# Patient Record
Sex: Female | Born: 1945
Health system: Southern US, Community
[De-identification: ages and names within clinical notes are randomized; demographics above are authoritative.]

## PROBLEM LIST (undated history)

## (undated) DIAGNOSIS — Z923 Personal history of irradiation: Secondary | ICD-10-CM

## (undated) DIAGNOSIS — E079 Disorder of thyroid, unspecified: Secondary | ICD-10-CM

## (undated) DIAGNOSIS — M810 Age-related osteoporosis without current pathological fracture: Secondary | ICD-10-CM

## (undated) DIAGNOSIS — I341 Nonrheumatic mitral (valve) prolapse: Secondary | ICD-10-CM

## (undated) DIAGNOSIS — R569 Unspecified convulsions: Secondary | ICD-10-CM

## (undated) DIAGNOSIS — E785 Hyperlipidemia, unspecified: Secondary | ICD-10-CM

## (undated) DIAGNOSIS — K219 Gastro-esophageal reflux disease without esophagitis: Secondary | ICD-10-CM

## (undated) DIAGNOSIS — Z803 Family history of malignant neoplasm of breast: Secondary | ICD-10-CM

## (undated) DIAGNOSIS — Z859 Personal history of malignant neoplasm, unspecified: Secondary | ICD-10-CM

## (undated) DIAGNOSIS — F419 Anxiety disorder, unspecified: Secondary | ICD-10-CM

## (undated) HISTORY — DX: Age-related osteoporosis without current pathological fracture: M81.0

## (undated) HISTORY — DX: Family history of malignant neoplasm of breast: Z80.3

## (undated) HISTORY — PX: ABDOMINAL HYSTERECTOMY: SHX81

## (undated) HISTORY — PX: APPENDECTOMY: SHX54

## (undated) HISTORY — DX: Hyperlipidemia, unspecified: E78.5

## (undated) HISTORY — DX: Anxiety disorder, unspecified: F41.9

## (undated) HISTORY — DX: Disorder of thyroid, unspecified: E07.9

## (undated) HISTORY — DX: Nonrheumatic mitral (valve) prolapse: I34.1

## (undated) HISTORY — DX: Personal history of malignant neoplasm, unspecified: Z85.9

## (undated) HISTORY — DX: Unspecified convulsions: R56.9

---

## 1993-04-11 HISTORY — PX: BREAST EXCISIONAL BIOPSY: SUR124

## 2014-04-11 HISTORY — PX: BREAST LUMPECTOMY: SHX2

## 2015-04-12 HISTORY — PX: MASTECTOMY, PARTIAL: SHX709

## 2016-04-10 HISTORY — PX: MASTECTOMY PARTIAL / LUMPECTOMY: SUR851

## 2019-09-06 DIAGNOSIS — Z170 Estrogen receptor positive status [ER+]: Secondary | ICD-10-CM | POA: Insufficient documentation

## 2019-09-06 DIAGNOSIS — E039 Hypothyroidism, unspecified: Secondary | ICD-10-CM | POA: Insufficient documentation

## 2019-09-06 DIAGNOSIS — G2581 Restless legs syndrome: Secondary | ICD-10-CM | POA: Insufficient documentation

## 2019-09-06 DIAGNOSIS — G40909 Epilepsy, unspecified, not intractable, without status epilepticus: Secondary | ICD-10-CM | POA: Insufficient documentation

## 2019-09-06 DIAGNOSIS — E7800 Pure hypercholesterolemia, unspecified: Secondary | ICD-10-CM | POA: Insufficient documentation

## 2019-09-06 DIAGNOSIS — C50912 Malignant neoplasm of unspecified site of left female breast: Secondary | ICD-10-CM | POA: Insufficient documentation

## 2019-09-06 DIAGNOSIS — F325 Major depressive disorder, single episode, in full remission: Secondary | ICD-10-CM | POA: Insufficient documentation

## 2019-09-25 ENCOUNTER — Inpatient Hospital Stay: Payer: Medicare HMO

## 2019-09-25 ENCOUNTER — Encounter: Payer: Self-pay | Admitting: Internal Medicine

## 2019-09-25 ENCOUNTER — Inpatient Hospital Stay: Payer: Medicare HMO | Admitting: Internal Medicine

## 2019-10-01 ENCOUNTER — Inpatient Hospital Stay: Payer: Medicare HMO

## 2019-10-01 ENCOUNTER — Inpatient Hospital Stay: Payer: Medicare HMO | Attending: Internal Medicine | Admitting: Internal Medicine

## 2019-10-01 ENCOUNTER — Encounter: Payer: Self-pay | Admitting: Internal Medicine

## 2019-10-01 ENCOUNTER — Other Ambulatory Visit: Payer: Self-pay

## 2019-10-01 ENCOUNTER — Encounter (INDEPENDENT_AMBULATORY_CARE_PROVIDER_SITE_OTHER): Payer: Self-pay

## 2019-10-01 DIAGNOSIS — Z803 Family history of malignant neoplasm of breast: Secondary | ICD-10-CM | POA: Diagnosis not present

## 2019-10-01 DIAGNOSIS — Z87891 Personal history of nicotine dependence: Secondary | ICD-10-CM

## 2019-10-01 DIAGNOSIS — Z79811 Long term (current) use of aromatase inhibitors: Secondary | ICD-10-CM

## 2019-10-01 DIAGNOSIS — M810 Age-related osteoporosis without current pathological fracture: Secondary | ICD-10-CM | POA: Diagnosis not present

## 2019-10-01 DIAGNOSIS — Z170 Estrogen receptor positive status [ER+]: Secondary | ICD-10-CM

## 2019-10-01 DIAGNOSIS — Z8262 Family history of osteoporosis: Secondary | ICD-10-CM | POA: Diagnosis not present

## 2019-10-01 DIAGNOSIS — C50812 Malignant neoplasm of overlapping sites of left female breast: Secondary | ICD-10-CM

## 2019-10-01 MED ORDER — ANASTROZOLE 1 MG PO TABS: 1.0000 mg | ORAL_TABLET | Freq: Every day | ORAL | 1 refills | Status: DC

## 2019-10-01 NOTE — Assessment & Plan Note (Deleted)
#   Left breast   # Surveillance osteoporosis/ BMD- ? Pill; previously on "drip" once a year.    # family history of breast cancer [38 mom dx- with breast cancer/died]; will make referal to genetics counseling.   # DISPOSITION: # labs today # genetics cousenlling referral

## 2019-10-01 NOTE — Assessment & Plan Note (Addendum)
#   Left breast cancer ER positive; early stage [2017; as reported by patient; no records available].  S/p radiation.  Stable.  Recommend at least 5 years of Arimidex.  Tolerating well.  Refill prescription for Arimidex  #Clinically no evidence of recurrence noted.  Patient due for mammogram June 2021.  We will try to get records/mammogram for baseline.  Will order mammograms when baseline imaging available.  # Osteoporosis [as per patient]/ BMD-currently on? Pill; previously on "drip" once a year.  Patient states that she is  # Family history of breast cancer [38 mom dx- with breast cancer/died]; will make referal to genetics counseling.  Previously recommended however issues with insurance/coverage.   Thank you Dr.Baboff for allowing me to participate in the care of your pleasant patient. Please do not hesitate to contact me with questions or concerns in the interim.  # DISPOSITION: # genetics cousenlling referral   # follow up in 6 months- MD; labs- cbc/cmp-Dr.B

## 2019-10-01 NOTE — Progress Notes (Signed)
[facility] NOTE  Patient Care Team: Derinda Late, MD as PCP - General (Family Medicine)  CHIEF COMPLAINTS/PURPOSE OF CONSULTATION: Breast cancer  #  Oncology History Overview Note  # 2017- LEFT BREAST CANCER ER POSITIVE; Her-2 NEG [s/p lumpectomy]; s/p RT; no chemo; ?   #Anastrozole  # SURVIVORSHIP:   # GENETICS: declined because of expense/ insurance  DIAGNOSIS: Left breast cancer  STAGE: ? Early stage        ;  GOALS: cure  CURRENT/MOST RECENT THERAPY : on arimidex     Malignant neoplasm of left breast in female, estrogen receptor positive ([place]) (Resolved)  09/06/2019 Initial Diagnosis   Malignant neoplasm of left breast in female, estrogen receptor positive ([place])   Carcinoma of overlapping sites of left breast in female, estrogen receptor positive ([place])  10/01/2019 Initial Diagnosis   Carcinoma of overlapping sites of left breast in female, estrogen receptor positive ([place])      HISTORY OF PRESENTING ILLNESS:  Tina Armstrong 74 y.o.  female prior history of breast cancer -early stage is here for new consultation/establish care.   Patient states she was found to have an abnormal screening mammogram which led to further work-up-including biopsy positive for breast cancer.  Patient underwent treatment-including surgery lumpectomy in [place].  She is not aware of the stage; however was told to be low risk.  She received radiation.  Did not receive chemotherapy.  She is currently on anastrozole.  Patient has been tolerating anastrozole fairly well.  Denies any worsening body aches joint pains but no nausea no vomiting.  No headaches.  Review of Systems  Constitutional: Negative for chills, diaphoresis, fever, malaise/fatigue and weight loss.  HENT: Negative for nosebleeds and sore throat.   Eyes: Negative for double vision.  Respiratory: Negative for cough, hemoptysis, sputum production, shortness of breath and wheezing.   Cardiovascular: Negative  for chest pain, palpitations, orthopnea and leg swelling.  Gastrointestinal: Negative for abdominal pain, blood in stool, constipation, diarrhea, heartburn, melena, nausea and vomiting.  Genitourinary: Negative for dysuria, frequency and urgency.  Musculoskeletal: Negative for back pain and joint pain.  Skin: Negative.  Negative for itching and rash.  Neurological: Negative for dizziness, tingling, focal weakness, weakness and headaches.  Endo/Heme/Allergies: Does not bruise/bleed easily.  Psychiatric/Behavioral: Negative for depression. The patient is not nervous/anxious and does not have insomnia.      MEDICAL HISTORY:  Past Medical History:  Diagnosis Date  . Anxiety   . History of cancer    breast  . Hyperlipemia   . Mitral valve prolapse   . Osteoporosis   . Seizures ([place])   . Thyroid disease     SURGICAL HISTORY: Past Surgical History:  Procedure Laterality Date  . ABDOMINAL HYSTERECTOMY     total  . APPENDECTOMY    . MASTECTOMY PARTIAL / LUMPECTOMY Left 04/10/2016  . MASTECTOMY, PARTIAL Left 04/12/2015    SOCIAL HISTORY: Social History   Socioeconomic History  . Marital status: Widowed    Spouse name: Not on file  . Number of children: Not on file  . Years of education: Not on file  . Highest education level: Not on file  Occupational History  . Not on file  Tobacco Use  . Smoking status: Former Smoker    Start date: 2002    Quit date: 2007    Years since quitting: 14.4  . Smokeless tobacco: Never Used  Substance and Sexual Activity  . Alcohol use: Yes    Alcohol/week: 1.0  standard drink    Types: 1 Glasses of wine per week  . Drug use: Never  . Sexual activity: Not on file  Other Topics Concern  . Not on file  Social History Narrative   Moved from [place] at age of 68 years; moved from [place] to [place] for family reasons- lives in [place] daughter.  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    Social Determinants of Health    Financial Resource Strain:   . Difficulty of Paying Living Expenses:   Food Insecurity:   . Worried About Charity fundraiser in the Last Year:   . Arboriculturist in the Last Year:   Transportation Needs:   . Film/video editor (Medical):   Marland Kitchen Lack of Transportation (Non-Medical):   Physical Activity:   . Days of Exercise per Week:   . Minutes of Exercise per Session:   Stress:   . Feeling of Stress :   Social Connections:   . Frequency of Communication with Friends and Family:   . Frequency of Social Gatherings with Friends and Family:   . Attends Religious Services:   . Active Member of Clubs or Organizations:   . Attends Archivist Meetings:   Marland Kitchen Marital Status:   Intimate Partner Violence:   . Fear of Current or Ex-Partner:   . Emotionally Abused:   Marland Kitchen Physically Abused:   . Sexually Abused:     FAMILY HISTORY: Family History  Problem Relation Age of Onset  . Breast cancer Mother   . Heart attack Father   . Heart attack Brother   . Osteoporosis Paternal Grandmother   . Heart attack Brother   . Heart attack Brother     ALLERGIES:  has No Known Allergies.  MEDICATIONS:  Current Outpatient Medications  Medication Sig Dispense Refill  . anastrozole (ARIMIDEX) 1 MG tablet Take 1 tablet (1 mg total) by mouth daily. 90 tablet 1  . atenolol (TENORMIN) 25 MG tablet Take 25 mg by mouth daily.    Marland Kitchen atorvastatin (LIPITOR) 20 MG tablet Take 1 tablet by mouth daily.    . Cholecalciferol 25 MCG (1000 UT) capsule Take 1,000 Units by mouth daily.    . cyanocobalamin 1000 MCG tablet Take 1,000 mcg by mouth daily.    . ferrous sulfate 325 (65 FE) MG tablet Take 325 mg by mouth daily.    Marland Kitchen gabapentin (NEURONTIN) 300 MG capsule Take 300 mg by mouth daily.    Marland Kitchen levothyroxine (SYNTHROID) 100 MCG tablet Take 100 mcg by mouth daily.    . meloxicam (MOBIC) 7.5 MG tablet Take 7.5 mg by mouth daily.    Marland Kitchen PARoxetine (PAXIL) 30 MG tablet Take 30 mg by mouth daily.    .  Potassium Gluconate 2.5 MEQ TABS Take 2.5 mEq by mouth daily.    Marland Kitchen zinc gluconate 50 MG tablet Take 50 mg by mouth daily.     No current facility-administered medications for this visit.      Marland Kitchen  PHYSICAL EXAMINATION: ECOG PERFORMANCE STATUS: 0 - Asymptomatic  Vitals:   10/01/19 1119  BP: (!) 96/55  Pulse: 61  Resp: 16  Temp: (!) 97.1 F (36.2 C)  SpO2: 99%   Filed Weights   10/01/19 1119  Weight: 131 lb 3.2 oz (59.5 kg)    Physical Exam HENT:     Head: Normocephalic and atraumatic.     Mouth/Throat:     Pharynx: No oropharyngeal exudate.  Eyes:  Pupils: Pupils are equal, round, and reactive to light.  Cardiovascular:     Rate and Rhythm: Normal rate and regular rhythm.  Pulmonary:     Effort: Pulmonary effort is normal. No respiratory distress.     Breath sounds: Normal breath sounds. No wheezing.  Abdominal:     General: Bowel sounds are normal. There is no distension.     Palpations: Abdomen is soft. There is no mass.     Tenderness: There is no abdominal tenderness. There is no guarding or rebound.  Musculoskeletal:        General: No tenderness. Normal range of motion.     Cervical back: Normal range of motion and neck supple.  Skin:    General: Skin is warm.     Comments: Right and left BREAST exam (in the presence of nurse)- no unusual skin changes or dominant masses felt. Surgical scars noted.    Neurological:     Mental Status: She is alert and oriented to person, place, and time.  Psychiatric:        Mood and Affect: Affect normal.      LABORATORY DATA:  I have reviewed the data as listed No results found for: WBC, HGB, HCT, MCV, PLT No results for input(s): NA, K, CL, CO2, GLUCOSE, BUN, CREATININE, CALCIUM, GFRNONAA, GFRAA, PROT, ALBUMIN, AST, ALT, ALKPHOS, BILITOT, BILIDIR, IBILI in the last 8760 hours.  RADIOGRAPHIC STUDIES: I have personally reviewed the radiological images as listed and agreed with the findings in the report. No results  found.  ASSESSMENT & PLAN:   Carcinoma of overlapping sites of left breast in female, estrogen receptor positive ([place]) # Left breast cancer ER positive; early stage [2017; as reported by patient; no records available].  S/p radiation.  Stable.  Recommend at least 5 years of Arimidex.  Tolerating well.  Refill prescription for Arimidex  #Clinically no evidence of recurrence noted.  Patient due for mammogram June 2021.  We will try to get records/mammogram for baseline.  Will order mammograms when baseline imaging available.  # Osteoporosis [as per patient]/ BMD-currently on? Pill; previously on "drip" once a year.  Patient states that she is  # Family history of breast cancer [38 mom dx- with breast cancer/died]; will make referal to genetics counseling.  Previously recommended however issues with insurance/coverage.   Thank you Dr.Baboff for allowing me to participate in the care of your pleasant patient. Please do not hesitate to contact me with questions or concerns in the interim.  # DISPOSITION: # genetics cousenlling referral   # follow up in 6 months- MD; labs- cbc/cmp-Dr.B  All questions were answered. The patient/family knows to call the clinic with any problems, questions or concerns.    Cammie Sickle, MD 10/01/2019 12:38 PM

## 2019-10-09 ENCOUNTER — Encounter: Payer: Self-pay | Admitting: Licensed Clinical Social Worker

## 2019-10-09 ENCOUNTER — Inpatient Hospital Stay (HOSPITAL_BASED_OUTPATIENT_CLINIC_OR_DEPARTMENT_OTHER): Payer: Medicare HMO | Admitting: Licensed Clinical Social Worker

## 2019-10-09 ENCOUNTER — Other Ambulatory Visit: Payer: Self-pay

## 2019-10-09 ENCOUNTER — Inpatient Hospital Stay: Payer: Medicare HMO

## 2019-10-09 DIAGNOSIS — Z803 Family history of malignant neoplasm of breast: Secondary | ICD-10-CM | POA: Diagnosis not present

## 2019-10-09 DIAGNOSIS — Z170 Estrogen receptor positive status [ER+]: Secondary | ICD-10-CM

## 2019-10-09 DIAGNOSIS — Z1379 Encounter for other screening for genetic and chromosomal anomalies: Secondary | ICD-10-CM | POA: Insufficient documentation

## 2019-10-09 DIAGNOSIS — C50812 Malignant neoplasm of overlapping sites of left female breast: Secondary | ICD-10-CM | POA: Diagnosis not present

## 2019-10-09 DIAGNOSIS — C50412 Malignant neoplasm of upper-outer quadrant of left female breast: Secondary | ICD-10-CM | POA: Diagnosis not present

## 2019-10-09 NOTE — Progress Notes (Signed)
REFERRING PROVIDER: Cammie Sickle, MD [place],  [place] [id number]  PRIMARY PROVIDER:  Derinda Late, MD  PRIMARY REASON FOR VISIT:  1. Carcinoma of overlapping sites of left breast in female, estrogen receptor positive ([place])   2. Family history of breast cancer     HISTORY OF PRESENT ILLNESS:   Tina Armstrong, a 74 y.o. female, was seen for a [facility] cancer genetics consultation at the request of Dr. Rogue Armstrong due to a personal and family history of cancer.  Tina Armstrong presents to clinic today to discuss the possibility of a hereditary predisposition to cancer, genetic testing, and to further clarify her future cancer risks, as well as potential cancer risks for family members.   At the age of 64, Tina Armstrong was diagnosed with cancer of the left breast. This was treated with lumpectomy, radiation, and arimidex.    CANCER HISTORY:  Oncology History Overview Note  # 2017- LEFT BREAST CANCER ER POSITIVE; Her-2 NEG [s/p lumpectomy]; s/p RT; no chemo; ?   #Anastrozole  # SURVIVORSHIP:   # GENETICS: declined because of expense/ insurance  DIAGNOSIS: Left breast cancer  STAGE: ? Early stage        ;  GOALS: cure  CURRENT/MOST RECENT THERAPY : on arimidex     Malignant neoplasm of left breast in female, estrogen receptor positive ([facility]) (Resolved)  09/06/2019 Initial Diagnosis   Malignant neoplasm of left breast in female, estrogen receptor positive ([place])   Carcinoma of overlapping sites of left breast in female, estrogen receptor positive ([place])  10/01/2019 Initial Diagnosis   Carcinoma of overlapping sites of left breast in female, estrogen receptor positive ([place])      RISK FACTORS:  Menarche was at age 19.  First live birth at age 11.  OCP use for approximately >10 years.  Ovaries intact: no.  Hysterectomy: yes.  Menopausal status: postmenopausal.  HRT use: possibly for a short time.  Colonoscopy: yes; polyps, she reports less than  10. Mammogram within the last year: yes. Number of breast biopsies: 2.   Past Medical History:  Diagnosis Date  . Anxiety   . Family history of breast cancer   . History of cancer    breast  . Hyperlipemia   . Mitral valve prolapse   . Osteoporosis   . Seizures ([place])   . Thyroid disease     Past Surgical History:  Procedure Laterality Date  . ABDOMINAL HYSTERECTOMY     total  . APPENDECTOMY    . MASTECTOMY PARTIAL / LUMPECTOMY Left 04/10/2016  . MASTECTOMY, PARTIAL Left 04/12/2015    Social History   Socioeconomic History  . Marital status: Widowed    Spouse name: Not on file  . Number of children: Not on file  . Years of education: Not on file  . Highest education level: Not on file  Occupational History  . Not on file  Tobacco Use  . Smoking status: Former Smoker    Start date: 2002    Quit date: 2007    Years since quitting: 14.5  . Smokeless tobacco: Never Used  Substance and Sexual Activity  . Alcohol use: Yes    Alcohol/week: 1.0 standard drink    Types: 1 Glasses of wine per week  . Drug use: Never  . Sexual activity: Not on file  Other Topics Concern  . Not on file  Social History Narrative   Moved from [place] at age of 38 years; moved from [place] to [place]  for family reasons- lives in [place].  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    Social Determinants of Health   Financial Resource Strain:   . Difficulty of Paying Living Expenses:   Food Insecurity:   . Worried About Charity fundraiser in the Last Year:   . Arboriculturist in the Last Year:   Transportation Needs:   . Film/video editor (Medical):   Marland Kitchen Lack of Transportation (Non-Medical):   Physical Activity:   . Days of Exercise per Week:   . Minutes of Exercise per Session:   Stress:   . Feeling of Stress :   Social Connections:   . Frequency of Communication with Friends and Family:   . Frequency of Social Gatherings with Friends and Family:   .  Attends Religious Services:   . Active Member of Clubs or Organizations:   . Attends Archivist Meetings:   Marland Kitchen Marital Status:      FAMILY HISTORY:  We obtained a detailed, 4-generation family history.  Significant diagnoses are listed below: Family History  Problem Relation Age of Onset  . Breast cancer Mother 34  . Heart attack Father   . Heart attack Brother   . Osteoporosis Paternal Grandmother   . Heart attack Brother   . Heart attack Brother    Tina Armstrong has a son, 79, and a daughter, 51, neither have had cancer. She had 3 brothers. One brother passed from a heart attack, her other brothers are living.   Tina Armstrong mother died of breast cancer at 38. Patient had 4 maternal aunts, 4 maternal uncles. No cancers for them or for first cousins that she is aware of, these family members are in [place]. No cancers in maternal grandparents.  Tina Armstrong father died at 71. He had one sister who died at 24, patient does not believe it was cancer. Paternal grandmother died young but patient is unsure of cause, grandfather died in his 18s-80s.  Tina Armstrong is unaware of previous family history of genetic testing for hereditary cancer risks. Patient's maternal ancestors are of [place] descent, and paternal ancestors are of [place] descent. There is no reported Ashkenazi Jewish ancestry. There is no known consanguinity.  GENETIC COUNSELING ASSESSMENT: Tina Armstrong is a 74 y.o. female with a personal and family history of breast cancer which is somewhat suggestive of a hereditary cancer syndrome and predisposition to cancer. We, therefore, discussed and recommended the following at today's visit.   DISCUSSION: We discussed that 5-10% of cancer is hereditary meaning that it is due to a mutation in a single gene that is passed down from generation to generation in a family. Most cases of hereditary breast cancer are associated with BRCA1 and BRCA2 genes, although there are other genes  associated with hereditary breast cancer as well.  We discussed that testing is beneficial for several reasons including knowing about other cancer risks, identifying potential screening and risk-reduction options that may be appropriate, and to understand if other family members could be at risk for cancer and allow them to undergo genetic testing.   We reviewed the characteristics, features and inheritance patterns of hereditary cancer syndromes. We also discussed genetic testing, including the appropriate family members to test, the process of testing, insurance coverage and turn-around-time for results. We discussed the implications of a negative, positive and/or variant of uncertain significant result. We recommended Tina Armstrong pursue genetic testing for the [facility] Common Hereditary Cancers gene  panel.   The Common Hereditary Cancers Panel offered by [facility] includes sequencing and/or deletion duplication testing of the following 48 genes: APC, ATM, AXIN2, BARD1, BMPR1A, BRCA1, BRCA2, BRIP1, CDH1, CDKN2A (p14ARF), CDKN2A (p16INK4a), CKD4, CHEK2, CTNNA1, DICER1, EPCAM (Deletion/duplication testing only), GREM1 (promoter region deletion/duplication testing only), KIT, MEN1, MLH1, MSH2, MSH3, MSH6, MUTYH, NBN, NF1, NHTL1, PALB2, PDGFRA, PMS2, POLD1, POLE, PTEN, RAD50, RAD51C, RAD51D, RNF43, SDHB, SDHC, SDHD, SMAD4, SMARCA4. STK11, TP53, TSC1, TSC2, and VHL.  The following genes were evaluated for sequence changes only: SDHA and HOXB13 c.251G>A variant only.  Based on Tina Armstrong personal and family history of cancer, she meets medical criteria for genetic testing. Despite that she meets criteria, she may still have an out of pocket cost. We discussed that if her out of pocket cost for testing is over $100, the laboratory will call and confirm whether she wants to proceed with testing.  If the out of pocket cost of testing is less than $100 she will be billed by the genetic testing laboratory.   PLAN:  Despite our recommendation, Tina Armstrong did not wish to pursue genetic testing at today's visit. She will speak with her daughter and granddaughter and let me know if she changes her mind. We understand this decision and remain available to coordinate genetic testing at any time in the future. We, therefore, recommend Tina Armstrong continue to follow the cancer screening guidelines given by her primary healthcare provider.  Based on Tina Armstrong family history, we recommended her maternal relatives have genetic counseling and testing. Tina Armstrong will let us know if we can be of any assistance in coordinating genetic counseling and/or testing for this family member.   Lastly, we encouraged Tina Armstrong to remain in contact with cancer genetics annually so that we can continuously update the family history and inform her of any changes in cancer genetics and testing that may be of benefit for this family.   Tina Armstrong questions were answered to her satisfaction today. Our contact information was provided should additional questions or concerns arise. Thank you for the referral and allowing [place] to share in the care of your patient.   Faith Rogue, MS, [facility] Genetic Counselor [email or web address] Phone: [phone]  The patient was seen for a total of 25 minutes in face-to-face genetic counseling.  Dr. Grayland Ormond was available for discussion regarding this case.   _______________________________________________________________________ For Office Staff:  Number of people involved in session: 1 Was an Intern/ student involved with case: no

## 2019-10-15 ENCOUNTER — Other Ambulatory Visit: Payer: Self-pay | Admitting: *Deleted

## 2019-10-15 DIAGNOSIS — C50812 Malignant neoplasm of overlapping sites of left female breast: Secondary | ICD-10-CM

## 2019-10-16 ENCOUNTER — Telehealth: Payer: Self-pay | Admitting: *Deleted

## 2019-10-16 NOTE — Telephone Encounter (Signed)
I received the CD's on Tuesday. I need to hand walk these to [facility]. Then after [facility] reviews, they can schedule the mammo.

## 2019-10-16 NOTE — Telephone Encounter (Signed)
Anderson Malta, RN hand delivered the CD's to Tina Armstrong this afternoon. Patient will be contacted by the [facility] dept with apt. Attempted to reach patient. No answer. Vm box not set up.

## 2019-10-16 NOTE — Telephone Encounter (Signed)
Patient called reporting that she is overdue for her mammogram and she would like to get one ordered even if you do not have her old records yet

## 2019-10-21 ENCOUNTER — Other Ambulatory Visit: Payer: Self-pay | Admitting: *Deleted

## 2019-10-21 DIAGNOSIS — Z170 Estrogen receptor positive status [ER+]: Secondary | ICD-10-CM

## 2019-10-21 NOTE — Telephone Encounter (Signed)
Colette, can you follow-up on the status of the mammogram.

## 2019-10-29 ENCOUNTER — Ambulatory Visit
Admission: RE | Admit: 2019-10-29 | Discharge: 2019-10-29 | Disposition: A | Discharge status: Home / Self Care | Payer: Medicare HMO | Source: Ambulatory Visit | Attending: Internal Medicine | Admitting: Internal Medicine

## 2019-10-29 ENCOUNTER — Encounter: Payer: Self-pay | Admitting: Radiology

## 2019-10-29 DIAGNOSIS — R921 Mammographic calcification found on diagnostic imaging of breast: Secondary | ICD-10-CM | POA: Diagnosis not present

## 2019-10-29 DIAGNOSIS — Z170 Estrogen receptor positive status [ER+]: Secondary | ICD-10-CM

## 2019-10-29 DIAGNOSIS — C50812 Malignant neoplasm of overlapping sites of left female breast: Secondary | ICD-10-CM

## 2019-10-29 DIAGNOSIS — N6489 Other specified disorders of breast: Secondary | ICD-10-CM | POA: Diagnosis not present

## 2019-10-29 HISTORY — DX: Personal history of irradiation: Z92.3

## 2019-12-13 DIAGNOSIS — R0781 Pleurodynia: Secondary | ICD-10-CM | POA: Diagnosis not present

## 2019-12-13 DIAGNOSIS — W010XXA Fall on same level from slipping, tripping and stumbling without subsequent striking against object, initial encounter: Secondary | ICD-10-CM | POA: Diagnosis not present

## 2019-12-13 DIAGNOSIS — S4991XA Unspecified injury of right shoulder and upper arm, initial encounter: Secondary | ICD-10-CM | POA: Diagnosis not present

## 2019-12-13 DIAGNOSIS — S2241XA Multiple fractures of ribs, right side, initial encounter for closed fracture: Secondary | ICD-10-CM | POA: Diagnosis not present

## 2019-12-13 DIAGNOSIS — M47814 Spondylosis without myelopathy or radiculopathy, thoracic region: Secondary | ICD-10-CM | POA: Diagnosis not present

## 2019-12-13 DIAGNOSIS — Z043 Encounter for examination and observation following other accident: Secondary | ICD-10-CM | POA: Diagnosis not present

## 2019-12-13 DIAGNOSIS — M419 Scoliosis, unspecified: Secondary | ICD-10-CM | POA: Diagnosis not present

## 2019-12-19 ENCOUNTER — Other Ambulatory Visit: Payer: Self-pay | Admitting: *Deleted

## 2019-12-19 DIAGNOSIS — S2241XD Multiple fractures of ribs, right side, subsequent encounter for fracture with routine healing: Secondary | ICD-10-CM | POA: Diagnosis not present

## 2019-12-19 MED ORDER — ANASTROZOLE 1 MG PO TABS: 1.0000 mg | ORAL_TABLET | Freq: Every day | ORAL | 1 refills | Status: DC

## 2019-12-19 NOTE — Telephone Encounter (Signed)
Patient requests refill for anastrozole order pended per MD approval.

## 2020-01-09 DIAGNOSIS — Z0100 Encounter for examination of eyes and vision without abnormal findings: Secondary | ICD-10-CM | POA: Diagnosis not present

## 2020-01-09 DIAGNOSIS — Z79810 Long term (current) use of selective estrogen receptor modulators (SERMs): Secondary | ICD-10-CM | POA: Diagnosis not present

## 2020-01-09 DIAGNOSIS — H5212 Myopia, left eye: Secondary | ICD-10-CM | POA: Diagnosis not present

## 2020-01-09 DIAGNOSIS — H52222 Regular astigmatism, left eye: Secondary | ICD-10-CM | POA: Diagnosis not present

## 2020-01-09 DIAGNOSIS — H524 Presbyopia: Secondary | ICD-10-CM | POA: Diagnosis not present

## 2020-01-09 DIAGNOSIS — H43813 Vitreous degeneration, bilateral: Secondary | ICD-10-CM | POA: Diagnosis not present

## 2020-01-09 DIAGNOSIS — H40023 Open angle with borderline findings, high risk, bilateral: Secondary | ICD-10-CM | POA: Diagnosis not present

## 2020-01-09 DIAGNOSIS — H25813 Combined forms of age-related cataract, bilateral: Secondary | ICD-10-CM | POA: Diagnosis not present

## 2020-03-02 DIAGNOSIS — E7800 Pure hypercholesterolemia, unspecified: Secondary | ICD-10-CM | POA: Diagnosis not present

## 2020-03-02 DIAGNOSIS — E039 Hypothyroidism, unspecified: Secondary | ICD-10-CM | POA: Diagnosis not present

## 2020-03-02 DIAGNOSIS — Z79899 Other long term (current) drug therapy: Secondary | ICD-10-CM | POA: Diagnosis not present

## 2020-03-09 DIAGNOSIS — R059 Cough, unspecified: Secondary | ICD-10-CM | POA: Diagnosis not present

## 2020-03-09 DIAGNOSIS — C50919 Malignant neoplasm of unspecified site of unspecified female breast: Secondary | ICD-10-CM | POA: Diagnosis not present

## 2020-03-09 DIAGNOSIS — Z23 Encounter for immunization: Secondary | ICD-10-CM | POA: Diagnosis not present

## 2020-03-09 DIAGNOSIS — F32A Depression, unspecified: Secondary | ICD-10-CM | POA: Diagnosis not present

## 2020-03-09 DIAGNOSIS — G2581 Restless legs syndrome: Secondary | ICD-10-CM | POA: Diagnosis not present

## 2020-03-09 DIAGNOSIS — E785 Hyperlipidemia, unspecified: Secondary | ICD-10-CM | POA: Diagnosis not present

## 2020-03-09 DIAGNOSIS — Z79899 Other long term (current) drug therapy: Secondary | ICD-10-CM | POA: Diagnosis not present

## 2020-03-09 DIAGNOSIS — G40909 Epilepsy, unspecified, not intractable, without status epilepticus: Secondary | ICD-10-CM | POA: Diagnosis not present

## 2020-03-09 DIAGNOSIS — E039 Hypothyroidism, unspecified: Secondary | ICD-10-CM | POA: Diagnosis not present

## 2020-03-12 DIAGNOSIS — Z79810 Long term (current) use of selective estrogen receptor modulators (SERMs): Secondary | ICD-10-CM | POA: Diagnosis not present

## 2020-03-12 DIAGNOSIS — H40011 Open angle with borderline findings, low risk, right eye: Secondary | ICD-10-CM | POA: Diagnosis not present

## 2020-03-12 DIAGNOSIS — H401221 Low-tension glaucoma, left eye, mild stage: Secondary | ICD-10-CM | POA: Diagnosis not present

## 2020-03-18 DIAGNOSIS — I517 Cardiomegaly: Secondary | ICD-10-CM | POA: Diagnosis not present

## 2020-03-18 DIAGNOSIS — R059 Cough, unspecified: Secondary | ICD-10-CM | POA: Diagnosis not present

## 2020-03-24 ENCOUNTER — Encounter: Payer: Self-pay | Admitting: Internal Medicine

## 2020-03-24 ENCOUNTER — Other Ambulatory Visit: Payer: Self-pay

## 2020-03-24 ENCOUNTER — Inpatient Hospital Stay: Payer: Medicare HMO | Attending: Internal Medicine

## 2020-03-24 ENCOUNTER — Inpatient Hospital Stay (HOSPITAL_BASED_OUTPATIENT_CLINIC_OR_DEPARTMENT_OTHER): Payer: Medicare HMO | Admitting: Internal Medicine

## 2020-03-24 ENCOUNTER — Other Ambulatory Visit
Admission: RE | Admit: 2020-03-24 | Discharge: 2020-03-24 | Disposition: A | Discharge status: Home / Self Care | Payer: Medicare HMO | Attending: Internal Medicine | Admitting: Internal Medicine

## 2020-03-24 DIAGNOSIS — Z803 Family history of malignant neoplasm of breast: Secondary | ICD-10-CM | POA: Diagnosis not present

## 2020-03-24 DIAGNOSIS — Z8249 Family history of ischemic heart disease and other diseases of the circulatory system: Secondary | ICD-10-CM | POA: Diagnosis not present

## 2020-03-24 DIAGNOSIS — Z170 Estrogen receptor positive status [ER+]: Secondary | ICD-10-CM | POA: Diagnosis not present

## 2020-03-24 DIAGNOSIS — C50812 Malignant neoplasm of overlapping sites of left female breast: Secondary | ICD-10-CM | POA: Insufficient documentation

## 2020-03-24 DIAGNOSIS — Z79899 Other long term (current) drug therapy: Secondary | ICD-10-CM | POA: Insufficient documentation

## 2020-03-24 DIAGNOSIS — Z90710 Acquired absence of both cervix and uterus: Secondary | ICD-10-CM | POA: Insufficient documentation

## 2020-03-24 DIAGNOSIS — E785 Hyperlipidemia, unspecified: Secondary | ICD-10-CM | POA: Diagnosis not present

## 2020-03-24 DIAGNOSIS — Z923 Personal history of irradiation: Secondary | ICD-10-CM | POA: Diagnosis not present

## 2020-03-24 DIAGNOSIS — Z79811 Long term (current) use of aromatase inhibitors: Secondary | ICD-10-CM | POA: Diagnosis not present

## 2020-03-24 DIAGNOSIS — Z87891 Personal history of nicotine dependence: Secondary | ICD-10-CM | POA: Diagnosis not present

## 2020-03-24 DIAGNOSIS — Z8262 Family history of osteoporosis: Secondary | ICD-10-CM | POA: Diagnosis not present

## 2020-03-24 LAB — COMPREHENSIVE METABOLIC PANEL
ALT: 22 U/L (ref 0–44)
AST: 27 U/L (ref 15–41)
Albumin: 4 g/dL (ref 3.5–5.0)
Alkaline Phosphatase: 69 U/L (ref 38–126)
Anion gap: 8 (ref 5–15)
BUN: 19 mg/dL (ref 8–23)
CO2: 26 mmol/L (ref 22–32)
Calcium: 9 mg/dL (ref 8.9–10.3)
Chloride: 104 mmol/L (ref 98–111)
Creatinine, Ser: 0.84 mg/dL (ref 0.44–1.00)
GFR, Estimated: >60 mL/min (ref >60)
Glucose, Bld: 90 mg/dL (ref 70–99)
Potassium: 4.5 mmol/L (ref 3.5–5.1)
Sodium: 138 mmol/L (ref 135–145)
Total Bilirubin: 0.7 mg/dL (ref 0.3–1.2)
Total Protein: 7.4 g/dL (ref 6.5–8.1)

## 2020-03-24 LAB — CBC WITH DIFFERENTIAL/PLATELET
Abs Immature Granulocytes: 0.02 10*3/uL (ref 0.00–0.07)
Basophils Absolute: 0.1 10*3/uL (ref 0.0–0.1)
Basophils Relative: 1 %
Eosinophils Absolute: 0.2 10*3/uL (ref 0.0–0.5)
Eosinophils Relative: 5 %
HCT: 38.6 % (ref 36.0–46.0)
Hemoglobin: 12.6 g/dL (ref 12.0–15.0)
Immature Granulocytes: 0 %
Lymphocytes Relative: 14 %
Lymphs Abs: 0.7 10*3/uL (ref 0.7–4.0)
MCH: 31.3 pg (ref 26.0–34.0)
MCHC: 32.6 g/dL (ref 30.0–36.0)
MCV: 95.8 fL (ref 80.0–100.0)
Monocytes Absolute: 0.7 10*3/uL (ref 0.1–1.0)
Monocytes Relative: 13 %
Neutro Abs: 3.2 10*3/uL (ref 1.7–7.7)
Neutrophils Relative %: 67 %
Platelets: 224 10*3/uL (ref 150–400)
RBC: 4.03 MIL/uL (ref 3.87–5.11)
RDW: 12.7 % (ref 11.5–15.5)
WBC: 4.9 10*3/uL (ref 4.0–10.5)
nRBC: 0 % (ref 0.0–0.2)

## 2020-03-24 NOTE — Progress Notes (Signed)
[facility] NOTE  Patient Care Team: Derinda Late, MD as PCP - General (Family Medicine)  CHIEF COMPLAINTS/PURPOSE OF CONSULTATION: Breast cancer  #  Oncology History Overview Note  # 2017- LEFT BREAST CANCER ER POSITIVE; Her-2 NEG [s/p lumpectomy]; s/p RT; no chemo; ?  [[place]]  #Anastrozole  # SURVIVORSHIP:   # GENETICS: declined because of expense/ insurance; july2021-s/p counseling; declines work up  DIAGNOSIS: Left breast cancer  STAGE: Early stage        ;  GOALS: cure  CURRENT/MOST RECENT THERAPY : on arimidex     Malignant neoplasm of left breast in female, estrogen receptor positive ([place]) (Resolved)  09/06/2019 Initial Diagnosis   Malignant neoplasm of left breast in female, estrogen receptor positive ([place])   Carcinoma of overlapping sites of left breast in female, estrogen receptor positive ([place])  10/01/2019 Initial Diagnosis   Carcinoma of overlapping sites of left breast in female, estrogen receptor positive ([place])      HISTORY OF PRESENTING ILLNESS:  Tina Armstrong 74 y.o.  female prior history of breast cancer -early stage is here for follow-up.  In the interim patient had a mammogram for tenderness in her left breast at site of lumpectomy scar.  No concerns of recurrent disease.  She continues to have intermittent pain at the site of surgery.  Otherwise no lumps or bumps anywhere else.  No nausea no vomiting no headaches.  No worsening joint pains or bone pain.   Review of Systems  Constitutional: Negative for chills, diaphoresis, fever, malaise/fatigue and weight loss.  HENT: Negative for nosebleeds and sore throat.   Eyes: Negative for double vision.  Respiratory: Negative for cough, hemoptysis, sputum production, shortness of breath and wheezing.   Cardiovascular: Negative for chest pain, palpitations, orthopnea and leg swelling.  Gastrointestinal: Negative for abdominal pain, blood in stool, constipation, diarrhea, heartburn,  melena, nausea and vomiting.  Genitourinary: Negative for dysuria, frequency and urgency.  Musculoskeletal: Negative for back pain and joint pain.  Skin: Negative.  Negative for itching and rash.  Neurological: Negative for dizziness, tingling, focal weakness, weakness and headaches.  Endo/Heme/Allergies: Does not bruise/bleed easily.  Psychiatric/Behavioral: Negative for depression. The patient is not nervous/anxious and does not have insomnia.      MEDICAL HISTORY:  Past Medical History:  Diagnosis Date  . Anxiety   . Family history of breast cancer   . History of cancer    breast  . Hyperlipemia   . Mitral valve prolapse   . Osteoporosis   . Personal history of radiation therapy    breast  . Seizures ([place])   . Thyroid disease     SURGICAL HISTORY: Past Surgical History:  Procedure Laterality Date  . ABDOMINAL HYSTERECTOMY     total  . APPENDECTOMY    . BREAST EXCISIONAL BIOPSY Left 1995  . BREAST LUMPECTOMY Left 2016  . MASTECTOMY PARTIAL / LUMPECTOMY Left 04/10/2016  . MASTECTOMY, PARTIAL Left 04/12/2015    SOCIAL HISTORY: Social History   Socioeconomic History  . Marital status: Widowed    Spouse name: Not on file  . Number of children: Not on file  . Years of education: Not on file  . Highest education level: Not on file  Occupational History  . Not on file  Tobacco Use  . Smoking status: Former Smoker    Start date: 2002    Quit date: 2007    Years since quitting: 14.9  . Smokeless tobacco: Never Used  Substance and Sexual  Activity  . Alcohol use: Yes    Alcohol/week: 1.0 standard drink    Types: 1 Glasses of wine per week  . Drug use: Never  . Sexual activity: Not on file  Other Topics Concern  . Not on file  Social History Narrative   Moved from [place] at age of 76 years; moved from [place] to [place] for family reasons- lives in [place] daughter.  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    Social Determinants of Health    Financial Resource Strain: Not on file  Food Insecurity: Not on file  Transportation Needs: Not on file  Physical Activity: Not on file  Stress: Not on file  Social Connections: Not on file  Intimate Partner Violence: Not on file    FAMILY HISTORY: Family History  Problem Relation Age of Onset  . Breast cancer Mother 100  . Heart attack Father   . Heart attack Brother   . Osteoporosis Paternal Grandmother   . Heart attack Brother   . Heart attack Brother     ALLERGIES:  has No Known Allergies.  MEDICATIONS:  Current Outpatient Medications  Medication Sig Dispense Refill  . anastrozole (ARIMIDEX) 1 MG tablet Take 1 tablet (1 mg total) by mouth daily. 90 tablet 1  . atenolol (TENORMIN) 25 MG tablet Take 25 mg by mouth daily.    Marland Kitchen atorvastatin (LIPITOR) 20 MG tablet Take 1 tablet by mouth daily.    . Cholecalciferol 25 MCG (1000 UT) capsule Take 1,000 Units by mouth daily.    . cyanocobalamin 1000 MCG tablet Take 1,000 mcg by mouth daily.    . ferrous sulfate 325 (65 FE) MG tablet Take 325 mg by mouth daily.    Marland Kitchen gabapentin (NEURONTIN) 300 MG capsule Take 300 mg by mouth daily.    Marland Kitchen levothyroxine (SYNTHROID) 100 MCG tablet Take 100 mcg by mouth daily.    . meloxicam (MOBIC) 7.5 MG tablet Take 7.5 mg by mouth daily.    . Potassium Gluconate 2.5 MEQ TABS Take 2.5 mEq by mouth daily.    Marland Kitchen zinc gluconate 50 MG tablet Take 50 mg by mouth daily.     No current facility-administered medications for this visit.      Marland Kitchen  PHYSICAL EXAMINATION: ECOG PERFORMANCE STATUS: 0 - Asymptomatic  Vitals:   03/24/20 1034  BP: 99/68  Pulse: (!) 58  Resp: 20  Temp: (!) 96.5 F (35.8 C)   Filed Weights   03/24/20 1034  Weight: 133 lb (60.3 kg)    Physical Exam HENT:     Head: Normocephalic and atraumatic.     Mouth/Throat:     Pharynx: No oropharyngeal exudate.  Eyes:     Pupils: Pupils are equal, round, and reactive to light.  Cardiovascular:     Rate and Rhythm: Normal rate  and regular rhythm.  Pulmonary:     Effort: Pulmonary effort is normal. No respiratory distress.     Breath sounds: Normal breath sounds. No wheezing.  Abdominal:     General: Bowel sounds are normal. There is no distension.     Palpations: Abdomen is soft. There is no mass.     Tenderness: There is no abdominal tenderness. There is no guarding or rebound.  Musculoskeletal:        General: No tenderness. Normal range of motion.     Cervical back: Normal range of motion and neck supple.  Skin:    General: Skin is warm.  Comments: Right and left BREAST exam (in the presence of nurse)- no unusual skin changes or dominant masses felt. Surgical scars noted.    Neurological:     Mental Status: She is alert and oriented to person, place, and time.  Psychiatric:        Mood and Affect: Affect normal.      LABORATORY DATA:  I have reviewed the data as listed Lab Results  Component Value Date   WBC 4.9 03/24/2020   HGB 12.6 03/24/2020   HCT 38.6 03/24/2020   MCV 95.8 03/24/2020   PLT 224 03/24/2020   Recent Labs    03/24/20 0935  NA 138  K 4.5  CL 104  CO2 26  GLUCOSE 90  BUN 19  CREATININE 0.84  CALCIUM 9.0  GFRNONAA >60  PROT 7.4  ALBUMIN 4.0  AST 27  ALT 22  ALKPHOS 69  BILITOT 0.7    RADIOGRAPHIC STUDIES: I have personally reviewed the radiological images as listed and agreed with the findings in the report. No results found.  ASSESSMENT & PLAN:   Carcinoma of overlapping sites of left breast in female, estrogen receptor positive ([place]) # Left breast cancer ER positive; early stage [2017; as reported by patient; no records available].  S/p radiation.  STABLE;  Recommended 5 years of Arimidex.   #Clinical exam no evidence of recurrence.  Mammogram June 2021-coarse calcification lumpectomy scar/fat necrosis likely.  Awaiting repeat mammogram in 6 months.  # Osteoporosis [as per patient]/ BMD-currently on? Pill; previously on "drip" once a year.  Will order  bone density at next visit.  Continue calcium plus vitamin D.  # Family history of breast cancer [38 mom dx- with breast cancer/died]; s/p genetics counseling; declines further evaluation.   #Unfortunately-no records available yet patient does not recollect what exactly she had a treatment/or who the doctor was in [place].  She states she lost information on her home computer.  However she will recall and inform us.  Signed a release.  # DISPOSITION:  # follow up in 6 months- MD; labs- cbc/cmp-Dr.B  All questions were answered. The patient/family knows to call the clinic with any problems, questions or concerns.    Cammie Sickle, MD 03/25/2020 7:54 AM

## 2020-03-24 NOTE — Assessment & Plan Note (Addendum)
#   Left breast cancer ER positive; early stage [2017; as reported by patient; no records available].  S/p radiation.  STABLE;  Recommended 5 years of Arimidex.   #Clinical exam no evidence of recurrence.  Mammogram June 2021-coarse calcification lumpectomy scar/fat necrosis likely.  Awaiting repeat mammogram in 6 months.  # Osteoporosis [as per patient]/ BMD-currently on? Pill; previously on "drip" once a year.  Will order bone density at next visit.  Continue calcium plus vitamin D.  # Family history of breast cancer [38 mom dx- with breast cancer/died]; s/p genetics counseling; declines further evaluation.   #Unfortunately-no records available yet patient does not recollect what exactly she had a treatment/or who the doctor was in [place].  She states she lost information on her home computer.  However she will recall and inform us.  Signed a release.  # DISPOSITION:  # follow up in 6 months- MD; labs- cbc/cmp-Dr.B

## 2020-04-06 ENCOUNTER — Other Ambulatory Visit: Payer: Self-pay | Admitting: *Deleted

## 2020-04-06 MED ORDER — ANASTROZOLE 1 MG PO TABS: 1.0000 mg | ORAL_TABLET | Freq: Every day | ORAL | 1 refills | Status: DC

## 2020-04-08 ENCOUNTER — Telehealth: Payer: Self-pay | Admitting: *Deleted

## 2020-04-08 NOTE — Telephone Encounter (Signed)
-----   Message from Rayfield Citizen sent at 04/08/2020 11:13 AM EST ----- Regarding: NEW RX REQUEST-[facility] Hello,   I just uploaded a NEW RX REQUEST-[facility] into this pts chart in Epic under the media tab. It needs to be signed and faxed back Thanks!

## 2020-04-08 NOTE — Telephone Encounter (Signed)
Anastrozole prescription already sent to patient's [facility] mail order pharmacy on 04/06/20

## 2020-04-23 DIAGNOSIS — H52222 Regular astigmatism, left eye: Secondary | ICD-10-CM | POA: Diagnosis not present

## 2020-04-23 DIAGNOSIS — H3561 Retinal hemorrhage, right eye: Secondary | ICD-10-CM | POA: Diagnosis not present

## 2020-04-23 DIAGNOSIS — H401221 Low-tension glaucoma, left eye, mild stage: Secondary | ICD-10-CM | POA: Diagnosis not present

## 2020-04-23 DIAGNOSIS — H40011 Open angle with borderline findings, low risk, right eye: Secondary | ICD-10-CM | POA: Diagnosis not present

## 2020-04-23 DIAGNOSIS — Z79810 Long term (current) use of selective estrogen receptor modulators (SERMs): Secondary | ICD-10-CM | POA: Diagnosis not present

## 2020-04-23 DIAGNOSIS — H43813 Vitreous degeneration, bilateral: Secondary | ICD-10-CM | POA: Diagnosis not present

## 2020-04-23 DIAGNOSIS — H524 Presbyopia: Secondary | ICD-10-CM | POA: Diagnosis not present

## 2020-04-23 DIAGNOSIS — H5212 Myopia, left eye: Secondary | ICD-10-CM | POA: Diagnosis not present

## 2020-04-23 DIAGNOSIS — H25813 Combined forms of age-related cataract, bilateral: Secondary | ICD-10-CM | POA: Diagnosis not present

## 2020-04-28 DIAGNOSIS — H3561 Retinal hemorrhage, right eye: Secondary | ICD-10-CM | POA: Diagnosis not present

## 2020-04-29 ENCOUNTER — Other Ambulatory Visit: Payer: Self-pay

## 2020-04-29 ENCOUNTER — Other Ambulatory Visit
Admission: RE | Admit: 2020-04-29 | Discharge: 2020-04-29 | Disposition: A | Discharge status: Home / Self Care | Payer: Medicare HMO | Attending: Ophthalmology | Admitting: Ophthalmology

## 2020-04-29 DIAGNOSIS — H3561 Retinal hemorrhage, right eye: Secondary | ICD-10-CM | POA: Insufficient documentation

## 2020-04-29 LAB — CBC
HCT: 39.2 % (ref 36.0–46.0)
Hemoglobin: 13.1 g/dL (ref 12.0–15.0)
MCH: 32 pg (ref 26.0–34.0)
MCHC: 33.4 g/dL (ref 30.0–36.0)
MCV: 95.6 fL (ref 80.0–100.0)
Platelets: 220 10*3/uL (ref 150–400)
RBC: 4.1 MIL/uL (ref 3.87–5.11)
RDW: 12.8 % (ref 11.5–15.5)
WBC: 5.8 10*3/uL (ref 4.0–10.5)
nRBC: 0 % (ref 0.0–0.2)

## 2020-04-29 LAB — SEDIMENTATION RATE: Sed Rate: 28 mm/hr (ref 0–30)

## 2020-04-29 LAB — C-REACTIVE PROTEIN: CRP: 0.6 mg/dL (ref <1.0)

## 2020-04-30 ENCOUNTER — Other Ambulatory Visit (INDEPENDENT_AMBULATORY_CARE_PROVIDER_SITE_OTHER): Payer: Self-pay | Admitting: Ophthalmology

## 2020-04-30 DIAGNOSIS — H348192 Central retinal vein occlusion, unspecified eye, stable: Secondary | ICD-10-CM

## 2020-05-01 ENCOUNTER — Ambulatory Visit
Admission: RE | Admit: 2020-05-01 | Discharge: 2020-05-01 | Disposition: A | Discharge status: Home / Self Care | Payer: Medicare HMO | Source: Ambulatory Visit | Attending: Internal Medicine | Admitting: Internal Medicine

## 2020-05-01 ENCOUNTER — Other Ambulatory Visit: Payer: Self-pay

## 2020-05-01 DIAGNOSIS — Z170 Estrogen receptor positive status [ER+]: Secondary | ICD-10-CM

## 2020-05-01 DIAGNOSIS — R921 Mammographic calcification found on diagnostic imaging of breast: Secondary | ICD-10-CM | POA: Diagnosis not present

## 2020-05-01 DIAGNOSIS — C50812 Malignant neoplasm of overlapping sites of left female breast: Secondary | ICD-10-CM | POA: Diagnosis not present

## 2020-05-05 DIAGNOSIS — H348192 Central retinal vein occlusion, unspecified eye, stable: Secondary | ICD-10-CM | POA: Diagnosis not present

## 2020-05-06 ENCOUNTER — Other Ambulatory Visit: Payer: Self-pay

## 2020-05-06 ENCOUNTER — Ambulatory Visit (INDEPENDENT_AMBULATORY_CARE_PROVIDER_SITE_OTHER): Payer: Medicare HMO

## 2020-05-06 DIAGNOSIS — H348192 Central retinal vein occlusion, unspecified eye, stable: Secondary | ICD-10-CM

## 2020-05-18 DIAGNOSIS — H348192 Central retinal vein occlusion, unspecified eye, stable: Secondary | ICD-10-CM | POA: Diagnosis not present

## 2020-06-29 DIAGNOSIS — H348192 Central retinal vein occlusion, unspecified eye, stable: Secondary | ICD-10-CM | POA: Diagnosis not present

## 2020-08-26 DIAGNOSIS — M9902 Segmental and somatic dysfunction of thoracic region: Secondary | ICD-10-CM | POA: Diagnosis not present

## 2020-08-26 DIAGNOSIS — M5033 Other cervical disc degeneration, cervicothoracic region: Secondary | ICD-10-CM | POA: Diagnosis not present

## 2020-08-26 DIAGNOSIS — M9901 Segmental and somatic dysfunction of cervical region: Secondary | ICD-10-CM | POA: Diagnosis not present

## 2020-08-26 DIAGNOSIS — M5412 Radiculopathy, cervical region: Secondary | ICD-10-CM | POA: Diagnosis not present

## 2020-08-27 DIAGNOSIS — M5412 Radiculopathy, cervical region: Secondary | ICD-10-CM | POA: Diagnosis not present

## 2020-08-27 DIAGNOSIS — M5033 Other cervical disc degeneration, cervicothoracic region: Secondary | ICD-10-CM | POA: Diagnosis not present

## 2020-08-27 DIAGNOSIS — M9901 Segmental and somatic dysfunction of cervical region: Secondary | ICD-10-CM | POA: Diagnosis not present

## 2020-08-27 DIAGNOSIS — M9902 Segmental and somatic dysfunction of thoracic region: Secondary | ICD-10-CM | POA: Diagnosis not present

## 2020-08-31 ENCOUNTER — Other Ambulatory Visit: Payer: Self-pay | Admitting: Internal Medicine

## 2020-08-31 DIAGNOSIS — M5033 Other cervical disc degeneration, cervicothoracic region: Secondary | ICD-10-CM | POA: Diagnosis not present

## 2020-08-31 DIAGNOSIS — M9902 Segmental and somatic dysfunction of thoracic region: Secondary | ICD-10-CM | POA: Diagnosis not present

## 2020-08-31 DIAGNOSIS — M9901 Segmental and somatic dysfunction of cervical region: Secondary | ICD-10-CM | POA: Diagnosis not present

## 2020-08-31 DIAGNOSIS — M5412 Radiculopathy, cervical region: Secondary | ICD-10-CM | POA: Diagnosis not present

## 2020-09-01 DIAGNOSIS — Z79899 Other long term (current) drug therapy: Secondary | ICD-10-CM | POA: Diagnosis not present

## 2020-09-01 DIAGNOSIS — E7800 Pure hypercholesterolemia, unspecified: Secondary | ICD-10-CM | POA: Diagnosis not present

## 2020-09-01 DIAGNOSIS — E039 Hypothyroidism, unspecified: Secondary | ICD-10-CM | POA: Diagnosis not present

## 2020-09-03 DIAGNOSIS — M9901 Segmental and somatic dysfunction of cervical region: Secondary | ICD-10-CM | POA: Diagnosis not present

## 2020-09-03 DIAGNOSIS — M9902 Segmental and somatic dysfunction of thoracic region: Secondary | ICD-10-CM | POA: Diagnosis not present

## 2020-09-03 DIAGNOSIS — M5412 Radiculopathy, cervical region: Secondary | ICD-10-CM | POA: Diagnosis not present

## 2020-09-03 DIAGNOSIS — M5033 Other cervical disc degeneration, cervicothoracic region: Secondary | ICD-10-CM | POA: Diagnosis not present

## 2020-09-08 DIAGNOSIS — F329 Major depressive disorder, single episode, unspecified: Secondary | ICD-10-CM | POA: Diagnosis not present

## 2020-09-08 DIAGNOSIS — Z0000 Encounter for general adult medical examination without abnormal findings: Secondary | ICD-10-CM | POA: Diagnosis not present

## 2020-09-08 DIAGNOSIS — Z170 Estrogen receptor positive status [ER+]: Secondary | ICD-10-CM | POA: Diagnosis not present

## 2020-09-08 DIAGNOSIS — C50919 Malignant neoplasm of unspecified site of unspecified female breast: Secondary | ICD-10-CM | POA: Diagnosis not present

## 2020-09-08 DIAGNOSIS — Z1331 Encounter for screening for depression: Secondary | ICD-10-CM | POA: Diagnosis not present

## 2020-09-08 DIAGNOSIS — Z79899 Other long term (current) drug therapy: Secondary | ICD-10-CM | POA: Diagnosis not present

## 2020-09-08 DIAGNOSIS — G40909 Epilepsy, unspecified, not intractable, without status epilepticus: Secondary | ICD-10-CM | POA: Diagnosis not present

## 2020-09-09 DIAGNOSIS — M5412 Radiculopathy, cervical region: Secondary | ICD-10-CM | POA: Diagnosis not present

## 2020-09-09 DIAGNOSIS — M5033 Other cervical disc degeneration, cervicothoracic region: Secondary | ICD-10-CM | POA: Diagnosis not present

## 2020-09-09 DIAGNOSIS — M9902 Segmental and somatic dysfunction of thoracic region: Secondary | ICD-10-CM | POA: Diagnosis not present

## 2020-09-09 DIAGNOSIS — M9901 Segmental and somatic dysfunction of cervical region: Secondary | ICD-10-CM | POA: Diagnosis not present

## 2020-09-10 DIAGNOSIS — M5412 Radiculopathy, cervical region: Secondary | ICD-10-CM | POA: Diagnosis not present

## 2020-09-10 DIAGNOSIS — M9902 Segmental and somatic dysfunction of thoracic region: Secondary | ICD-10-CM | POA: Diagnosis not present

## 2020-09-10 DIAGNOSIS — M9901 Segmental and somatic dysfunction of cervical region: Secondary | ICD-10-CM | POA: Diagnosis not present

## 2020-09-10 DIAGNOSIS — M5033 Other cervical disc degeneration, cervicothoracic region: Secondary | ICD-10-CM | POA: Diagnosis not present

## 2020-09-11 DIAGNOSIS — M5033 Other cervical disc degeneration, cervicothoracic region: Secondary | ICD-10-CM | POA: Diagnosis not present

## 2020-09-11 DIAGNOSIS — M5412 Radiculopathy, cervical region: Secondary | ICD-10-CM | POA: Diagnosis not present

## 2020-09-11 DIAGNOSIS — M9902 Segmental and somatic dysfunction of thoracic region: Secondary | ICD-10-CM | POA: Diagnosis not present

## 2020-09-11 DIAGNOSIS — M9901 Segmental and somatic dysfunction of cervical region: Secondary | ICD-10-CM | POA: Diagnosis not present

## 2020-09-14 DIAGNOSIS — M5033 Other cervical disc degeneration, cervicothoracic region: Secondary | ICD-10-CM | POA: Diagnosis not present

## 2020-09-14 DIAGNOSIS — M9901 Segmental and somatic dysfunction of cervical region: Secondary | ICD-10-CM | POA: Diagnosis not present

## 2020-09-14 DIAGNOSIS — M5412 Radiculopathy, cervical region: Secondary | ICD-10-CM | POA: Diagnosis not present

## 2020-09-14 DIAGNOSIS — M9902 Segmental and somatic dysfunction of thoracic region: Secondary | ICD-10-CM | POA: Diagnosis not present

## 2020-09-16 DIAGNOSIS — M5033 Other cervical disc degeneration, cervicothoracic region: Secondary | ICD-10-CM | POA: Diagnosis not present

## 2020-09-16 DIAGNOSIS — M9902 Segmental and somatic dysfunction of thoracic region: Secondary | ICD-10-CM | POA: Diagnosis not present

## 2020-09-16 DIAGNOSIS — M9901 Segmental and somatic dysfunction of cervical region: Secondary | ICD-10-CM | POA: Diagnosis not present

## 2020-09-16 DIAGNOSIS — M5412 Radiculopathy, cervical region: Secondary | ICD-10-CM | POA: Diagnosis not present

## 2020-09-22 DIAGNOSIS — M9901 Segmental and somatic dysfunction of cervical region: Secondary | ICD-10-CM | POA: Diagnosis not present

## 2020-09-22 DIAGNOSIS — M9902 Segmental and somatic dysfunction of thoracic region: Secondary | ICD-10-CM | POA: Diagnosis not present

## 2020-09-22 DIAGNOSIS — M5412 Radiculopathy, cervical region: Secondary | ICD-10-CM | POA: Diagnosis not present

## 2020-09-22 DIAGNOSIS — M5033 Other cervical disc degeneration, cervicothoracic region: Secondary | ICD-10-CM | POA: Diagnosis not present

## 2020-09-24 DIAGNOSIS — M9902 Segmental and somatic dysfunction of thoracic region: Secondary | ICD-10-CM | POA: Diagnosis not present

## 2020-09-24 DIAGNOSIS — M5412 Radiculopathy, cervical region: Secondary | ICD-10-CM | POA: Diagnosis not present

## 2020-09-24 DIAGNOSIS — M5033 Other cervical disc degeneration, cervicothoracic region: Secondary | ICD-10-CM | POA: Diagnosis not present

## 2020-09-24 DIAGNOSIS — M9901 Segmental and somatic dysfunction of cervical region: Secondary | ICD-10-CM | POA: Diagnosis not present

## 2020-09-29 DIAGNOSIS — H40009 Preglaucoma, unspecified, unspecified eye: Secondary | ICD-10-CM | POA: Diagnosis not present

## 2020-09-30 DIAGNOSIS — M5412 Radiculopathy, cervical region: Secondary | ICD-10-CM | POA: Diagnosis not present

## 2020-09-30 DIAGNOSIS — M5033 Other cervical disc degeneration, cervicothoracic region: Secondary | ICD-10-CM | POA: Diagnosis not present

## 2020-09-30 DIAGNOSIS — M9902 Segmental and somatic dysfunction of thoracic region: Secondary | ICD-10-CM | POA: Diagnosis not present

## 2020-09-30 DIAGNOSIS — M9901 Segmental and somatic dysfunction of cervical region: Secondary | ICD-10-CM | POA: Diagnosis not present

## 2020-10-06 ENCOUNTER — Other Ambulatory Visit: Payer: Self-pay | Admitting: Family Medicine

## 2020-10-06 DIAGNOSIS — R928 Other abnormal and inconclusive findings on diagnostic imaging of breast: Secondary | ICD-10-CM

## 2020-10-07 ENCOUNTER — Other Ambulatory Visit: Payer: Self-pay | Admitting: Family Medicine

## 2020-10-07 DIAGNOSIS — E7800 Pure hypercholesterolemia, unspecified: Secondary | ICD-10-CM | POA: Diagnosis not present

## 2020-10-07 DIAGNOSIS — Z7689 Persons encountering health services in other specified circumstances: Secondary | ICD-10-CM | POA: Diagnosis not present

## 2020-10-07 DIAGNOSIS — R002 Palpitations: Secondary | ICD-10-CM | POA: Insufficient documentation

## 2020-10-07 DIAGNOSIS — R001 Bradycardia, unspecified: Secondary | ICD-10-CM | POA: Diagnosis not present

## 2020-10-07 DIAGNOSIS — M9901 Segmental and somatic dysfunction of cervical region: Secondary | ICD-10-CM | POA: Diagnosis not present

## 2020-10-07 DIAGNOSIS — M5033 Other cervical disc degeneration, cervicothoracic region: Secondary | ICD-10-CM | POA: Diagnosis not present

## 2020-10-07 DIAGNOSIS — R0602 Shortness of breath: Secondary | ICD-10-CM | POA: Diagnosis not present

## 2020-10-07 DIAGNOSIS — M5412 Radiculopathy, cervical region: Secondary | ICD-10-CM | POA: Diagnosis not present

## 2020-10-07 DIAGNOSIS — M9902 Segmental and somatic dysfunction of thoracic region: Secondary | ICD-10-CM | POA: Diagnosis not present

## 2020-10-08 ENCOUNTER — Other Ambulatory Visit: Payer: Self-pay | Admitting: Family Medicine

## 2020-10-08 DIAGNOSIS — R928 Other abnormal and inconclusive findings on diagnostic imaging of breast: Secondary | ICD-10-CM

## 2020-10-14 DIAGNOSIS — M9901 Segmental and somatic dysfunction of cervical region: Secondary | ICD-10-CM | POA: Diagnosis not present

## 2020-10-14 DIAGNOSIS — M5412 Radiculopathy, cervical region: Secondary | ICD-10-CM | POA: Diagnosis not present

## 2020-10-14 DIAGNOSIS — M5033 Other cervical disc degeneration, cervicothoracic region: Secondary | ICD-10-CM | POA: Diagnosis not present

## 2020-10-14 DIAGNOSIS — M9902 Segmental and somatic dysfunction of thoracic region: Secondary | ICD-10-CM | POA: Diagnosis not present

## 2020-10-21 DIAGNOSIS — M5412 Radiculopathy, cervical region: Secondary | ICD-10-CM | POA: Diagnosis not present

## 2020-10-21 DIAGNOSIS — M5033 Other cervical disc degeneration, cervicothoracic region: Secondary | ICD-10-CM | POA: Diagnosis not present

## 2020-10-21 DIAGNOSIS — M9902 Segmental and somatic dysfunction of thoracic region: Secondary | ICD-10-CM | POA: Diagnosis not present

## 2020-10-21 DIAGNOSIS — M9901 Segmental and somatic dysfunction of cervical region: Secondary | ICD-10-CM | POA: Diagnosis not present

## 2020-10-28 DIAGNOSIS — M5412 Radiculopathy, cervical region: Secondary | ICD-10-CM | POA: Diagnosis not present

## 2020-10-28 DIAGNOSIS — M9901 Segmental and somatic dysfunction of cervical region: Secondary | ICD-10-CM | POA: Diagnosis not present

## 2020-10-28 DIAGNOSIS — M9902 Segmental and somatic dysfunction of thoracic region: Secondary | ICD-10-CM | POA: Diagnosis not present

## 2020-10-28 DIAGNOSIS — M5033 Other cervical disc degeneration, cervicothoracic region: Secondary | ICD-10-CM | POA: Diagnosis not present

## 2020-10-30 ENCOUNTER — Other Ambulatory Visit: Payer: Self-pay

## 2020-10-30 ENCOUNTER — Ambulatory Visit
Admission: RE | Admit: 2020-10-30 | Discharge: 2020-10-30 | Disposition: A | Discharge status: Home / Self Care | Payer: Medicare HMO | Source: Ambulatory Visit | Attending: Family Medicine | Admitting: Family Medicine

## 2020-10-30 DIAGNOSIS — R928 Other abnormal and inconclusive findings on diagnostic imaging of breast: Secondary | ICD-10-CM

## 2020-10-30 DIAGNOSIS — R922 Inconclusive mammogram: Secondary | ICD-10-CM | POA: Diagnosis not present

## 2020-10-30 DIAGNOSIS — R921 Mammographic calcification found on diagnostic imaging of breast: Secondary | ICD-10-CM | POA: Diagnosis not present

## 2020-11-04 DIAGNOSIS — R001 Bradycardia, unspecified: Secondary | ICD-10-CM | POA: Diagnosis not present

## 2020-11-04 DIAGNOSIS — R0602 Shortness of breath: Secondary | ICD-10-CM | POA: Diagnosis not present

## 2020-11-11 DIAGNOSIS — M5033 Other cervical disc degeneration, cervicothoracic region: Secondary | ICD-10-CM | POA: Diagnosis not present

## 2020-11-11 DIAGNOSIS — M9901 Segmental and somatic dysfunction of cervical region: Secondary | ICD-10-CM | POA: Diagnosis not present

## 2020-11-11 DIAGNOSIS — M5412 Radiculopathy, cervical region: Secondary | ICD-10-CM | POA: Diagnosis not present

## 2020-11-11 DIAGNOSIS — M9902 Segmental and somatic dysfunction of thoracic region: Secondary | ICD-10-CM | POA: Diagnosis not present

## 2020-12-01 DIAGNOSIS — R002 Palpitations: Secondary | ICD-10-CM | POA: Diagnosis not present

## 2020-12-01 DIAGNOSIS — E7800 Pure hypercholesterolemia, unspecified: Secondary | ICD-10-CM | POA: Diagnosis not present

## 2020-12-01 DIAGNOSIS — R001 Bradycardia, unspecified: Secondary | ICD-10-CM | POA: Diagnosis not present

## 2020-12-02 DIAGNOSIS — M9902 Segmental and somatic dysfunction of thoracic region: Secondary | ICD-10-CM | POA: Diagnosis not present

## 2020-12-02 DIAGNOSIS — M9901 Segmental and somatic dysfunction of cervical region: Secondary | ICD-10-CM | POA: Diagnosis not present

## 2020-12-02 DIAGNOSIS — M5412 Radiculopathy, cervical region: Secondary | ICD-10-CM | POA: Diagnosis not present

## 2020-12-02 DIAGNOSIS — M5033 Other cervical disc degeneration, cervicothoracic region: Secondary | ICD-10-CM | POA: Diagnosis not present

## 2020-12-16 DIAGNOSIS — M9902 Segmental and somatic dysfunction of thoracic region: Secondary | ICD-10-CM | POA: Diagnosis not present

## 2020-12-16 DIAGNOSIS — M5033 Other cervical disc degeneration, cervicothoracic region: Secondary | ICD-10-CM | POA: Diagnosis not present

## 2020-12-16 DIAGNOSIS — M9901 Segmental and somatic dysfunction of cervical region: Secondary | ICD-10-CM | POA: Diagnosis not present

## 2020-12-16 DIAGNOSIS — M5412 Radiculopathy, cervical region: Secondary | ICD-10-CM | POA: Diagnosis not present

## 2020-12-29 DIAGNOSIS — R300 Dysuria: Secondary | ICD-10-CM | POA: Diagnosis not present

## 2020-12-30 DIAGNOSIS — N390 Urinary tract infection, site not specified: Secondary | ICD-10-CM | POA: Diagnosis not present

## 2020-12-30 DIAGNOSIS — M5033 Other cervical disc degeneration, cervicothoracic region: Secondary | ICD-10-CM | POA: Diagnosis not present

## 2020-12-30 DIAGNOSIS — M5412 Radiculopathy, cervical region: Secondary | ICD-10-CM | POA: Diagnosis not present

## 2020-12-30 DIAGNOSIS — R300 Dysuria: Secondary | ICD-10-CM | POA: Diagnosis not present

## 2020-12-30 DIAGNOSIS — M9901 Segmental and somatic dysfunction of cervical region: Secondary | ICD-10-CM | POA: Diagnosis not present

## 2020-12-30 DIAGNOSIS — M9902 Segmental and somatic dysfunction of thoracic region: Secondary | ICD-10-CM | POA: Diagnosis not present

## 2021-01-08 DIAGNOSIS — R300 Dysuria: Secondary | ICD-10-CM | POA: Diagnosis not present

## 2021-01-20 DIAGNOSIS — M5033 Other cervical disc degeneration, cervicothoracic region: Secondary | ICD-10-CM | POA: Diagnosis not present

## 2021-01-20 DIAGNOSIS — M5412 Radiculopathy, cervical region: Secondary | ICD-10-CM | POA: Diagnosis not present

## 2021-01-20 DIAGNOSIS — M9901 Segmental and somatic dysfunction of cervical region: Secondary | ICD-10-CM | POA: Diagnosis not present

## 2021-01-20 DIAGNOSIS — M9902 Segmental and somatic dysfunction of thoracic region: Secondary | ICD-10-CM | POA: Diagnosis not present

## 2021-02-11 DIAGNOSIS — M9902 Segmental and somatic dysfunction of thoracic region: Secondary | ICD-10-CM | POA: Diagnosis not present

## 2021-02-11 DIAGNOSIS — M5033 Other cervical disc degeneration, cervicothoracic region: Secondary | ICD-10-CM | POA: Diagnosis not present

## 2021-02-11 DIAGNOSIS — M9901 Segmental and somatic dysfunction of cervical region: Secondary | ICD-10-CM | POA: Diagnosis not present

## 2021-02-11 DIAGNOSIS — M5412 Radiculopathy, cervical region: Secondary | ICD-10-CM | POA: Diagnosis not present

## 2021-03-11 DIAGNOSIS — C50919 Malignant neoplasm of unspecified site of unspecified female breast: Secondary | ICD-10-CM | POA: Diagnosis not present

## 2021-03-11 DIAGNOSIS — Z780 Asymptomatic menopausal state: Secondary | ICD-10-CM | POA: Diagnosis not present

## 2021-03-11 DIAGNOSIS — F325 Major depressive disorder, single episode, in full remission: Secondary | ICD-10-CM | POA: Diagnosis not present

## 2021-03-11 DIAGNOSIS — M5412 Radiculopathy, cervical region: Secondary | ICD-10-CM | POA: Diagnosis not present

## 2021-03-11 DIAGNOSIS — E7800 Pure hypercholesterolemia, unspecified: Secondary | ICD-10-CM | POA: Diagnosis not present

## 2021-03-11 DIAGNOSIS — E039 Hypothyroidism, unspecified: Secondary | ICD-10-CM | POA: Diagnosis not present

## 2021-03-11 DIAGNOSIS — E785 Hyperlipidemia, unspecified: Secondary | ICD-10-CM | POA: Diagnosis not present

## 2021-03-11 DIAGNOSIS — M5033 Other cervical disc degeneration, cervicothoracic region: Secondary | ICD-10-CM | POA: Diagnosis not present

## 2021-03-11 DIAGNOSIS — Z23 Encounter for immunization: Secondary | ICD-10-CM | POA: Diagnosis not present

## 2021-03-11 DIAGNOSIS — Z79899 Other long term (current) drug therapy: Secondary | ICD-10-CM | POA: Diagnosis not present

## 2021-03-11 DIAGNOSIS — G40909 Epilepsy, unspecified, not intractable, without status epilepticus: Secondary | ICD-10-CM | POA: Diagnosis not present

## 2021-03-11 DIAGNOSIS — M9901 Segmental and somatic dysfunction of cervical region: Secondary | ICD-10-CM | POA: Diagnosis not present

## 2021-03-11 DIAGNOSIS — M9902 Segmental and somatic dysfunction of thoracic region: Secondary | ICD-10-CM | POA: Diagnosis not present

## 2021-03-11 DIAGNOSIS — C50912 Malignant neoplasm of unspecified site of left female breast: Secondary | ICD-10-CM | POA: Diagnosis not present

## 2021-03-24 ENCOUNTER — Ambulatory Visit: Payer: Medicare HMO | Admitting: Internal Medicine

## 2021-03-24 ENCOUNTER — Other Ambulatory Visit: Payer: Medicare HMO

## 2021-04-01 ENCOUNTER — Other Ambulatory Visit: Payer: Self-pay | Admitting: *Deleted

## 2021-04-01 DIAGNOSIS — Z170 Estrogen receptor positive status [ER+]: Secondary | ICD-10-CM

## 2021-04-01 DIAGNOSIS — C50812 Malignant neoplasm of overlapping sites of left female breast: Secondary | ICD-10-CM

## 2021-04-09 ENCOUNTER — Other Ambulatory Visit: Payer: Self-pay

## 2021-04-09 ENCOUNTER — Telehealth: Payer: Self-pay

## 2021-04-09 ENCOUNTER — Inpatient Hospital Stay (HOSPITAL_BASED_OUTPATIENT_CLINIC_OR_DEPARTMENT_OTHER): Payer: Medicare HMO | Admitting: Internal Medicine

## 2021-04-09 ENCOUNTER — Encounter: Payer: Self-pay | Admitting: Internal Medicine

## 2021-04-09 ENCOUNTER — Inpatient Hospital Stay: Payer: Medicare HMO | Attending: Internal Medicine

## 2021-04-09 DIAGNOSIS — Z87891 Personal history of nicotine dependence: Secondary | ICD-10-CM | POA: Diagnosis not present

## 2021-04-09 DIAGNOSIS — Z170 Estrogen receptor positive status [ER+]: Secondary | ICD-10-CM | POA: Diagnosis not present

## 2021-04-09 DIAGNOSIS — Z79899 Other long term (current) drug therapy: Secondary | ICD-10-CM | POA: Diagnosis not present

## 2021-04-09 DIAGNOSIS — Z79811 Long term (current) use of aromatase inhibitors: Secondary | ICD-10-CM | POA: Insufficient documentation

## 2021-04-09 DIAGNOSIS — C50812 Malignant neoplasm of overlapping sites of left female breast: Secondary | ICD-10-CM | POA: Insufficient documentation

## 2021-04-09 DIAGNOSIS — Z923 Personal history of irradiation: Secondary | ICD-10-CM | POA: Diagnosis not present

## 2021-04-09 DIAGNOSIS — Z7982 Long term (current) use of aspirin: Secondary | ICD-10-CM | POA: Diagnosis not present

## 2021-04-09 LAB — COMPREHENSIVE METABOLIC PANEL
ALT: 57 U/L — ABNORMAL HIGH (ref 0–44)
AST: 59 U/L — ABNORMAL HIGH (ref 15–41)
Albumin: 4 g/dL (ref 3.5–5.0)
Alkaline Phosphatase: 72 U/L (ref 38–126)
Anion gap: 11 (ref 5–15)
BUN: 29 mg/dL — ABNORMAL HIGH (ref 8–23)
CO2: 26 mmol/L (ref 22–32)
Calcium: 9.3 mg/dL (ref 8.9–10.3)
Chloride: 99 mmol/L (ref 98–111)
Creatinine, Ser: 0.75 mg/dL (ref 0.44–1.00)
GFR, Estimated: >60 mL/min (ref >60)
Glucose, Bld: 87 mg/dL (ref 70–99)
Potassium: 3.6 mmol/L (ref 3.5–5.1)
Sodium: 136 mmol/L (ref 135–145)
Total Bilirubin: 0.4 mg/dL (ref 0.3–1.2)
Total Protein: 7.2 g/dL (ref 6.5–8.1)

## 2021-04-09 LAB — CBC WITH DIFFERENTIAL/PLATELET
Abs Immature Granulocytes: 0.02 10*3/uL (ref 0.00–0.07)
Basophils Absolute: 0.1 10*3/uL (ref 0.0–0.1)
Basophils Relative: 1 %
Eosinophils Absolute: 0.3 10*3/uL (ref 0.0–0.5)
Eosinophils Relative: 5 %
HCT: 36.9 % (ref 36.0–46.0)
Hemoglobin: 12.3 g/dL (ref 12.0–15.0)
Immature Granulocytes: 0 %
Lymphocytes Relative: 14 %
Lymphs Abs: 0.8 10*3/uL (ref 0.7–4.0)
MCH: 31.3 pg (ref 26.0–34.0)
MCHC: 33.3 g/dL (ref 30.0–36.0)
MCV: 93.9 fL (ref 80.0–100.0)
Monocytes Absolute: 0.8 10*3/uL (ref 0.1–1.0)
Monocytes Relative: 14 %
Neutro Abs: 3.7 10*3/uL (ref 1.7–7.7)
Neutrophils Relative %: 66 %
Platelets: 225 10*3/uL (ref 150–400)
RBC: 3.93 MIL/uL (ref 3.87–5.11)
RDW: 13.3 % (ref 11.5–15.5)
WBC: 5.5 10*3/uL (ref 4.0–10.5)
nRBC: 0 % (ref 0.0–0.2)

## 2021-04-09 NOTE — Telephone Encounter (Signed)
Patient was previously seen by Gerilyn Pilgrim, MD at [facility] with last visit in 2019.  Called the office [phone]) to confirm it is the correct office patient was seen and fax number to request medical records but the office closes at 1:00 pm on Friday.    Dr. Ninetta Lights at [facility] [phone]) was her surgeon in 2017 or 2018.

## 2021-04-09 NOTE — Progress Notes (Signed)
[facility] [place] NOTE  Patient Care Team: Derinda Late, MD as PCP - General (Family Medicine)  CHIEF COMPLAINTS/PURPOSE OF CONSULTATION: Breast cancer  #  Oncology History Overview Note  # 2017- LEFT BREAST CANCER ER POSITIVE; Her-2 NEG [s/p lumpectomy]; s/p RT; no chemo; ?  [[place]]  #Anastrozole [dec 3926]  # SURVIVORSHIP:   # GENETICS: declined because of expense/ insurance; july2021-s/p counseling; declines work up  DIAGNOSIS: Left breast cancer  STAGE: Early stage        ;  GOALS: cure  CURRENT/MOST RECENT THERAPY : on arimidex     Malignant neoplasm of left breast in female, estrogen receptor positive ([place]) (Resolved)  09/06/2019 Initial Diagnosis   Malignant neoplasm of left breast in female, estrogen receptor positive ([place])   Carcinoma of overlapping sites of left breast in female, estrogen receptor positive ([place])  10/01/2019 Initial Diagnosis   Carcinoma of overlapping sites of left breast in female, estrogen receptor positive ([place])      HISTORY OF PRESENTING ILLNESS: Ambulating independently.  Alone. Tina Armstrong 75 y.o.  female prior history of breast cancer -early stage ER/PR positive as per history on anastrozole is here for follow-up.  Unfortunately unable to get records from [place].  However today patient remembers the name of her oncologist.    Chronic pain in the left breast.  Mammogram July 2022-negative.  Otherwise no lumps or bumps anywhere else.  No nausea no vomiting no headaches.  No worsening joint pains or bone pain.   Review of Systems  Constitutional:  Negative for chills, diaphoresis, fever, malaise/fatigue and weight loss.  HENT:  Negative for nosebleeds and sore throat.   Eyes:  Negative for double vision.  Respiratory:  Negative for cough, hemoptysis, sputum production, shortness of breath and wheezing.   Cardiovascular:  Negative for chest pain, palpitations, orthopnea and leg swelling.  Gastrointestinal:   Negative for abdominal pain, blood in stool, constipation, diarrhea, heartburn, melena, nausea and vomiting.  Genitourinary:  Negative for dysuria, frequency and urgency.  Musculoskeletal:  Negative for back pain and joint pain.  Skin: Negative.  Negative for itching and rash.  Neurological:  Negative for dizziness, tingling, focal weakness, weakness and headaches.  Endo/Heme/Allergies:  Does not bruise/bleed easily.  Psychiatric/Behavioral:  Negative for depression. The patient is not nervous/anxious and does not have insomnia.     MEDICAL HISTORY:  Past Medical History:  Diagnosis Date   Anxiety    Family history of breast cancer    History of cancer    breast   Hyperlipemia    Mitral valve prolapse    Osteoporosis    Personal history of radiation therapy    breast   Seizures ([place])    Thyroid disease     SURGICAL HISTORY: Past Surgical History:  Procedure Laterality Date   ABDOMINAL HYSTERECTOMY     total   APPENDECTOMY     BREAST EXCISIONAL BIOPSY Left 1995   BREAST LUMPECTOMY Left 2016   MASTECTOMY PARTIAL / LUMPECTOMY Left 04/10/2016   MASTECTOMY, PARTIAL Left 04/12/2015    SOCIAL HISTORY: Social History   Socioeconomic History   Marital status: Widowed    Spouse name: Not on file   Number of children: Not on file   Years of education: Not on file   Highest education level: Not on file  Occupational History   Not on file  Tobacco Use   Smoking status: Former    Types: Cigarettes    Start date: 2002  Quit date: 2007    Years since quitting: 16.0   Smokeless tobacco: Never  Substance and Sexual Activity   Alcohol use: Yes    Alcohol/week: 1.0 standard drink    Types: 1 Glasses of wine per week   Drug use: Never   Sexual activity: Not on file  Other Topics Concern   Not on file  Social History Narrative   Moved from [place] at age of 19 years; moved from [place] to [place] for family reasons- lives in [place] daughter.  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    Social Determinants of Health   Financial Resource Strain: Not on file  Food Insecurity: Not on file  Transportation Needs: Not on file  Physical Activity: Not on file  Stress: Not on file  Social Connections: Not on file  Intimate Partner Violence: Not on file    FAMILY HISTORY: Family History  Problem Relation Age of Onset   Breast cancer Mother 26   Heart attack Father    Heart attack Brother    Osteoporosis Paternal Grandmother    Heart attack Brother    Heart attack Brother     ALLERGIES:  is allergic to atenolol and diltiazem.  MEDICATIONS:  Current Outpatient Medications  Medication Sig Dispense Refill   anastrozole (ARIMIDEX) 1 MG tablet TAKE 1 TABLET (1 MG TOTAL) BY MOUTH DAILY. 90 tablet 1   aspirin EC 81 MG tablet Take 81 mg by mouth daily. Swallow whole.     atorvastatin (LIPITOR) 20 MG tablet Take 1 tablet by mouth daily.     Calcium Carb-Cholecalciferol (CALCIUM 1000 + D PO) Take 1 tablet by mouth daily.     Cholecalciferol 25 MCG (1000 UT) capsule Take 1,000 Units by mouth daily.     cyanocobalamin 1000 MCG tablet Take 1,000 mcg by mouth daily.     ferrous sulfate 325 (65 FE) MG tablet Take 325 mg by mouth daily.     gabapentin (NEURONTIN) 300 MG capsule Take 300 mg by mouth daily.     levothyroxine (SYNTHROID) 100 MCG tablet Take 100 mcg by mouth daily.     Potassium Gluconate 2.5 MEQ TABS Take 2.5 mEq by mouth daily.     zinc gluconate 50 MG tablet Take 50 mg by mouth daily.     atenolol (TENORMIN) 25 MG tablet Take 25 mg by mouth daily.     meloxicam (MOBIC) 7.5 MG tablet Take 7.5 mg by mouth daily. (Patient not taking: Reported on 04/09/2021)     No current facility-administered medications for this visit.      Marland Kitchen  PHYSICAL EXAMINATION: ECOG PERFORMANCE STATUS: 0 - Asymptomatic  Vitals:   04/09/21 1357  BP: 112/73  Pulse: 66  Resp: 16  Temp: 99.2 F (37.3 C)   Filed Weights   04/09/21 1357  Weight: 125 lb  12.8 oz (57.1 kg)    Physical Exam HENT:     Head: Normocephalic and atraumatic.     Mouth/Throat:     Pharynx: No oropharyngeal exudate.  Eyes:     Pupils: Pupils are equal, round, and reactive to light.  Cardiovascular:     Rate and Rhythm: Normal rate and regular rhythm.  Pulmonary:     Effort: Pulmonary effort is normal. No respiratory distress.     Breath sounds: Normal breath sounds. No wheezing.  Abdominal:     General: Bowel sounds are normal. There is no distension.     Palpations: Abdomen is soft.  There is no mass.     Tenderness: There is no abdominal tenderness. There is no guarding or rebound.  Musculoskeletal:        General: No tenderness. Normal range of motion.     Cervical back: Normal range of motion and neck supple.  Skin:    General: Skin is warm.     Comments: Right and left BREAST exam [in the presence of nurse]- no unusual skin changes or dominant masses felt. Surgical scars noted.    Neurological:     Mental Status: She is alert and oriented to person, place, and time.  Psychiatric:        Mood and Affect: Affect normal.     LABORATORY DATA:  I have reviewed the data as listed Lab Results  Component Value Date   WBC 5.5 04/09/2021   HGB 12.3 04/09/2021   HCT 36.9 04/09/2021   MCV 93.9 04/09/2021   PLT 225 04/09/2021   Recent Labs    04/09/21 1326  NA 136  K 3.6  CL 99  CO2 26  GLUCOSE 87  BUN 29*  CREATININE 0.75  CALCIUM 9.3  GFRNONAA >60  PROT 7.2  ALBUMIN 4.0  AST 59*  ALT 57*  ALKPHOS 72  BILITOT 0.4    RADIOGRAPHIC STUDIES: I have personally reviewed the radiological images as listed and agreed with the findings in the report. No results found.  ASSESSMENT & PLAN:   Carcinoma of overlapping sites of left breast in female, estrogen receptor positive ([place]) # Left breast cancer ER positive; early stage [2017; as reported by patient; no records available].  S/p radiation.  STABLE;  Recommended 5 years of Arimidex [started  dec 2018].   #Clinical exam no evidence of recurrence.  Mammogram  JUly 2022-coarse calcification lumpectomy scar/fat necrosis likely.  Awaiting repeat mammogram in 1 year.  # Osteoporosis [as per patient]/ BMD-currently on? Pill; previously on "drip" once a year.  Awaiting  bone density with PCP-Dr.Baboff  Continue calcium plus vitamin D.  #Unfortunately-no records available yet;Hospital in [place]. .  She states she lost information on her home computer.  Signed a release.  We will try to obtain records..  # DISPOSITION:  # follow up in 6 months- MD; labs- cbc/cmp-Dr.B  All questions were answered. The patient/family knows to call the clinic with any problems, questions or concerns.    Cammie Sickle, MD 04/09/2021 3:18 PM

## 2021-04-09 NOTE — Assessment & Plan Note (Addendum)
#   Left breast cancer ER positive; early stage [2017; as reported by patient; no records available].  S/p radiation.  STABLE;  Recommended 5 years of Arimidex [started dec 2018].   #Clinical exam no evidence of recurrence.  Mammogram  JUly 2022-coarse calcification lumpectomy scar/fat necrosis likely.  Awaiting repeat mammogram in 1 year.  # Osteoporosis [as per patient]/ BMD-currently on? Pill; previously on "drip" once a year.  Awaiting  bone density with PCP-Dr.Baboff  Continue calcium plus vitamin D.  #Unfortunately-no records available yet;[place]ospital in [place]-with Dr.Berry. .  She states she lost information on her home computer.  Signed a release.  We will try to obtain records..  # DISPOSITION:  # follow up in 6 months- MD; labs- cbc/cmp-Dr.B 

## 2021-04-09 NOTE — Progress Notes (Signed)
Patient denies new problems/concerns today.   °

## 2021-04-13 NOTE — Telephone Encounter (Signed)
Patient was seen/treated by Dr. Corwin Levins at [facility] location beginning in 2016.  Records request for oncology records faxed to [phone].  Phone nuber: [phone].

## 2021-04-14 ENCOUNTER — Other Ambulatory Visit: Payer: Self-pay | Admitting: Internal Medicine

## 2021-04-15 DIAGNOSIS — M5033 Other cervical disc degeneration, cervicothoracic region: Secondary | ICD-10-CM | POA: Diagnosis not present

## 2021-04-15 DIAGNOSIS — M5412 Radiculopathy, cervical region: Secondary | ICD-10-CM | POA: Diagnosis not present

## 2021-04-15 DIAGNOSIS — M9901 Segmental and somatic dysfunction of cervical region: Secondary | ICD-10-CM | POA: Diagnosis not present

## 2021-04-15 DIAGNOSIS — M9902 Segmental and somatic dysfunction of thoracic region: Secondary | ICD-10-CM | POA: Diagnosis not present

## 2021-04-23 DIAGNOSIS — J069 Acute upper respiratory infection, unspecified: Secondary | ICD-10-CM | POA: Diagnosis not present

## 2021-04-23 NOTE — Telephone Encounter (Signed)
Records received and scanned in chart.

## 2021-05-11 ENCOUNTER — Other Ambulatory Visit: Payer: Self-pay | Admitting: Ophthalmology

## 2021-05-11 DIAGNOSIS — H532 Diplopia: Secondary | ICD-10-CM | POA: Diagnosis not present

## 2021-05-11 DIAGNOSIS — H40003 Preglaucoma, unspecified, bilateral: Secondary | ICD-10-CM | POA: Diagnosis not present

## 2021-05-11 DIAGNOSIS — H348192 Central retinal vein occlusion, unspecified eye, stable: Secondary | ICD-10-CM | POA: Diagnosis not present

## 2021-05-18 ENCOUNTER — Ambulatory Visit
Admission: RE | Admit: 2021-05-18 | Discharge: 2021-05-18 | Disposition: A | Discharge status: Home / Self Care | Payer: Medicare HMO | Source: Ambulatory Visit | Attending: Ophthalmology | Admitting: Ophthalmology

## 2021-05-18 DIAGNOSIS — H532 Diplopia: Secondary | ICD-10-CM | POA: Diagnosis not present

## 2021-05-18 MED ORDER — GADOBUTROL 1 MMOL/ML IV SOLN
6.0000 mL | Freq: Once | INTRAVENOUS | Status: AC | PRN
  Administered 2021-05-18: 6 mL via INTRAVENOUS

## 2021-05-20 DIAGNOSIS — M9901 Segmental and somatic dysfunction of cervical region: Secondary | ICD-10-CM | POA: Diagnosis not present

## 2021-05-20 DIAGNOSIS — M9902 Segmental and somatic dysfunction of thoracic region: Secondary | ICD-10-CM | POA: Diagnosis not present

## 2021-05-20 DIAGNOSIS — M5412 Radiculopathy, cervical region: Secondary | ICD-10-CM | POA: Diagnosis not present

## 2021-05-20 DIAGNOSIS — M5033 Other cervical disc degeneration, cervicothoracic region: Secondary | ICD-10-CM | POA: Diagnosis not present

## 2021-05-31 DIAGNOSIS — M810 Age-related osteoporosis without current pathological fracture: Secondary | ICD-10-CM | POA: Diagnosis not present

## 2021-06-16 ENCOUNTER — Other Ambulatory Visit: Payer: Self-pay

## 2021-06-16 ENCOUNTER — Ambulatory Visit (INDEPENDENT_AMBULATORY_CARE_PROVIDER_SITE_OTHER): Payer: Medicare HMO

## 2021-06-16 ENCOUNTER — Encounter: Payer: Self-pay | Admitting: Podiatry

## 2021-06-16 ENCOUNTER — Ambulatory Visit: Payer: Medicare HMO | Admitting: Podiatry

## 2021-06-16 DIAGNOSIS — M2041 Other hammer toe(s) (acquired), right foot: Secondary | ICD-10-CM

## 2021-06-16 DIAGNOSIS — M2011 Hallux valgus (acquired), right foot: Secondary | ICD-10-CM

## 2021-06-16 NOTE — Progress Notes (Signed)
?Subjective:  ?Patient ID: Tina Armstrong, female    DOB: [birth year],  MRN: [id number] ?HPI ?Chief Complaint  ?Patient presents with  ? Foot Pain  ?  1st MPJ and 2nd toe right - bunion and hammertoe deformity x years, more pain now, 2nd toe overlaps, shoes uncomfortable, pain plantar forefoot as well  ? New Patient (Initial Visit)  ? ? ?76 y.o. female presents with the above complaint.  ? ?ROS: Denies fever chills nausea vomiting muscle aches pains calf pain back pain chest pain shortness of breath. ? ?Past Medical History:  ?Diagnosis Date  ? Anxiety   ? Family history of breast cancer   ? History of cancer   ? breast  ? Hyperlipemia   ? Mitral valve prolapse   ? Osteoporosis   ? Personal history of radiation therapy   ? breast  ? Seizures ([place])   ? Thyroid disease   ? ?Past Surgical History:  ?Procedure Laterality Date  ? ABDOMINAL HYSTERECTOMY    ? total  ? APPENDECTOMY    ? BREAST EXCISIONAL BIOPSY Left 1995  ? BREAST LUMPECTOMY Left 2016  ? MASTECTOMY PARTIAL / LUMPECTOMY Left 04/10/2016  ? MASTECTOMY, PARTIAL Left 04/12/2015  ? ? ?Current Outpatient Medications:  ?  anastrozole (ARIMIDEX) 1 MG tablet, TAKE 1 TABLET EVERY DAY, Disp: 90 tablet, Rfl: 1 ?  aspirin EC 81 MG tablet, Take 81 mg by mouth daily. Swallow whole., Disp: , Rfl:  ?  atorvastatin (LIPITOR) 20 MG tablet, Take 1 tablet by mouth daily., Disp: , Rfl:  ?  Calcium Carb-Cholecalciferol (CALCIUM 1000 + D PO), Take 1 tablet by mouth daily., Disp: , Rfl:  ?  Cholecalciferol 25 MCG (1000 UT) capsule, Take 1,000 Units by mouth daily., Disp: , Rfl:  ?  cyanocobalamin 1000 MCG tablet, Take 1,000 mcg by mouth daily., Disp: , Rfl:  ?  ferrous sulfate 325 (65 FE) MG tablet, Take 325 mg by mouth daily., Disp: , Rfl:  ?  gabapentin (NEURONTIN) 300 MG capsule, Take 300 mg by mouth daily., Disp: , Rfl:  ?  levothyroxine (SYNTHROID) 100 MCG tablet, Take 100 mcg by mouth daily., Disp: , Rfl:  ?  meloxicam (MOBIC) 7.5 MG tablet, Take 7.5 mg by mouth daily. (Patient  not taking: Reported on 04/09/2021), Disp: , Rfl:  ?  pantoprazole (PROTONIX) 20 MG tablet, Take 20 mg by mouth daily., Disp: , Rfl:  ?  Potassium Gluconate 2.5 MEQ TABS, Take 2.5 mEq by mouth daily., Disp: , Rfl:  ?  zinc gluconate 50 MG tablet, Take 50 mg by mouth daily., Disp: , Rfl:  ? ?Allergies  ?Allergen Reactions  ? Atenolol Other (See Comments)  ?  Made patient very shaky   ? Diltiazem Other (See Comments)  ?  Made the patient very shaky   ? ?Review of Systems ?Objective:  ?There were no vitals filed for this visit. ? ?General: Well developed, nourished, in no acute distress, alert and oriented x3  ? ?Dermatological: Skin is warm, dry and supple bilateral. Nails x 10 are well maintained; remaining integument appears unremarkable at this time. There are no open sores, no preulcerative lesions, no rash or signs of infection present. ? ?Vascular: Dorsalis Pedis artery and Posterior Tibial artery pedal pulses are 2/4 bilateral with immedate capillary fill time. Pedal hair growth present. No varicosities and no lower extremity edema present bilateral.  ? ?Neruologic: Grossly intact via light touch bilateral. Vibratory intact via tuning fork bilateral. Protective threshold with Semmes Wienstein monofilament intact  to all pedal sites bilateral. Patellar and Achilles deep tendon reflexes 2+ bilateral. No Babinski or clonus noted bilateral.  ? ?Musculoskeletal: No gross boney pedal deformities bilateral. No pain, crepitus, or limitation noted with foot and ankle range of motion bilateral. Muscular strength 5/5 in all groups tested bilateral.  Right foot demonstrates severe bunion deformity with overlapping second toe. ? ?Gait: Unassisted, Nonantalgic.  ? ? ?Radiographs: ? ?Radiographs demonstrate severe bunion deformity with Advagraf overlapping second toe of the right foot and osteoarthritic changes at the level of the second metatarsal phalangeal joint of the right foot.  She also has osteoarthritic changes in the  head of the first metatarsal. ? ?Assessment & Plan:  ? ?Assessment: Hallux valgus deformity with overlapping second toe. ? ?Plan: Discussed surgical intervention with her today she will go home and think about this and let us know if she decides she wants to do it I did explain to her that it can take up to 3 months for her to hear her to get back to her routine ambulation. ? ? ? ? ?Jassiah Viviano T. [place], DPM ?

## 2021-06-21 DIAGNOSIS — N8111 Cystocele, midline: Secondary | ICD-10-CM | POA: Diagnosis not present

## 2021-06-21 DIAGNOSIS — H532 Diplopia: Secondary | ICD-10-CM | POA: Diagnosis not present

## 2021-06-21 DIAGNOSIS — N816 Rectocele: Secondary | ICD-10-CM | POA: Diagnosis not present

## 2021-06-21 DIAGNOSIS — N993 Prolapse of vaginal vault after hysterectomy: Secondary | ICD-10-CM | POA: Diagnosis not present

## 2021-06-29 ENCOUNTER — Other Ambulatory Visit: Payer: Self-pay

## 2021-06-29 ENCOUNTER — Ambulatory Visit (INDEPENDENT_AMBULATORY_CARE_PROVIDER_SITE_OTHER): Payer: Medicare HMO

## 2021-06-29 ENCOUNTER — Ambulatory Visit: Payer: Medicare HMO | Admitting: Podiatry

## 2021-06-29 DIAGNOSIS — S9031XA Contusion of right foot, initial encounter: Secondary | ICD-10-CM

## 2021-06-29 NOTE — Progress Notes (Signed)
? ?  HPI: 75 y.o. female presenting today for new complaint regarding an injury to the right second toe.  Patient states that on Friday, 06/25/2021, she sustained an injury when a brick fell on her right second toe.  She presents for further treatment and evaluation ? ?Past Medical History:  ?Diagnosis Date  ? Anxiety   ? Family history of breast cancer   ? History of cancer   ? breast  ? Hyperlipemia   ? Mitral valve prolapse   ? Osteoporosis   ? Personal history of radiation therapy   ? breast  ? Seizures ([place])   ? Thyroid disease   ? ? ?Past Surgical History:  ?Procedure Laterality Date  ? ABDOMINAL HYSTERECTOMY    ? total  ? APPENDECTOMY    ? BREAST EXCISIONAL BIOPSY Left 1995  ? BREAST LUMPECTOMY Left 2016  ? MASTECTOMY PARTIAL / LUMPECTOMY Left 04/10/2016  ? MASTECTOMY, PARTIAL Left 04/12/2015  ? ? ?Allergies  ?Allergen Reactions  ? Atenolol Other (See Comments)  ?  Made patient very shaky   ? Diltiazem Other (See Comments)  ?  Made the patient very shaky   ? ?  ?Physical Exam: ?General: The patient is alert and oriented x3 in no acute distress. ? ?Dermatology: Skin is warm, dry and supple bilateral lower extremities. Negative for open lesions or macerations. ? ?Vascular: Palpable pedal pulses bilaterally. Capillary refill within normal limits.  No ecchymosis.  There is some edema with slight erythema to the distal portion of the right second toe ? ?Neurological: Light touch and protective threshold grossly intact ? ?Musculoskeletal Exam: Hallux valgus deformity with overlapping second digit right foot ? ?Radiographic Exam:  ?Normal osseous mineralization.  No fractures identified.  As stated, hallux valgus with overlapping hammertoe of the second digit. ? ?Assessment: ?1.  Right second toe injury, negative for fracture ? ? ?Plan of Care:  ?1. Patient evaluated. X-Rays reviewed.  ?2.  Recommend rest and ice as needed ?3.  Recommend open toed shoes that do not irritate the toe ?4.  Return to clinic as needed for  surgical consult of the bunion and hammertoe deformity with Dr. Milinda Pointer ? ?  ?  ?Edrick Kins, DPM ?[place] ? ?Dr. Edrick Kins, DPM  ?  ?[address]                                        ?[place], [place] [address]                ?Office [phone]  ?Fax [phone] ? ? ? ? ?

## 2021-07-01 DIAGNOSIS — M9901 Segmental and somatic dysfunction of cervical region: Secondary | ICD-10-CM | POA: Diagnosis not present

## 2021-07-01 DIAGNOSIS — M5412 Radiculopathy, cervical region: Secondary | ICD-10-CM | POA: Diagnosis not present

## 2021-07-01 DIAGNOSIS — M5033 Other cervical disc degeneration, cervicothoracic region: Secondary | ICD-10-CM | POA: Diagnosis not present

## 2021-07-01 DIAGNOSIS — M9902 Segmental and somatic dysfunction of thoracic region: Secondary | ICD-10-CM | POA: Diagnosis not present

## 2021-07-12 DIAGNOSIS — C50919 Malignant neoplasm of unspecified site of unspecified female breast: Secondary | ICD-10-CM | POA: Diagnosis not present

## 2021-07-12 DIAGNOSIS — Z113 Encounter for screening for infections with a predominantly sexual mode of transmission: Secondary | ICD-10-CM | POA: Diagnosis not present

## 2021-07-12 DIAGNOSIS — E559 Vitamin D deficiency, unspecified: Secondary | ICD-10-CM | POA: Diagnosis not present

## 2021-07-12 DIAGNOSIS — E039 Hypothyroidism, unspecified: Secondary | ICD-10-CM | POA: Diagnosis not present

## 2021-07-12 DIAGNOSIS — R413 Other amnesia: Secondary | ICD-10-CM | POA: Diagnosis not present

## 2021-08-12 DIAGNOSIS — M5412 Radiculopathy, cervical region: Secondary | ICD-10-CM | POA: Diagnosis not present

## 2021-08-12 DIAGNOSIS — M5033 Other cervical disc degeneration, cervicothoracic region: Secondary | ICD-10-CM | POA: Diagnosis not present

## 2021-08-12 DIAGNOSIS — M9902 Segmental and somatic dysfunction of thoracic region: Secondary | ICD-10-CM | POA: Diagnosis not present

## 2021-08-12 DIAGNOSIS — M9901 Segmental and somatic dysfunction of cervical region: Secondary | ICD-10-CM | POA: Diagnosis not present

## 2021-08-19 ENCOUNTER — Encounter: Payer: Self-pay | Admitting: Internal Medicine

## 2021-09-23 DIAGNOSIS — M9902 Segmental and somatic dysfunction of thoracic region: Secondary | ICD-10-CM | POA: Diagnosis not present

## 2021-09-23 DIAGNOSIS — M5033 Other cervical disc degeneration, cervicothoracic region: Secondary | ICD-10-CM | POA: Diagnosis not present

## 2021-09-23 DIAGNOSIS — M9901 Segmental and somatic dysfunction of cervical region: Secondary | ICD-10-CM | POA: Diagnosis not present

## 2021-09-23 DIAGNOSIS — M5412 Radiculopathy, cervical region: Secondary | ICD-10-CM | POA: Diagnosis not present

## 2021-09-27 DIAGNOSIS — H532 Diplopia: Secondary | ICD-10-CM | POA: Diagnosis not present

## 2021-09-29 ENCOUNTER — Other Ambulatory Visit: Payer: Self-pay | Admitting: Family Medicine

## 2021-10-04 ENCOUNTER — Other Ambulatory Visit: Payer: Self-pay | Admitting: Family Medicine

## 2021-10-04 DIAGNOSIS — Z853 Personal history of malignant neoplasm of breast: Secondary | ICD-10-CM

## 2021-10-04 DIAGNOSIS — R921 Mammographic calcification found on diagnostic imaging of breast: Secondary | ICD-10-CM

## 2021-10-04 DIAGNOSIS — Z1231 Encounter for screening mammogram for malignant neoplasm of breast: Secondary | ICD-10-CM

## 2021-10-08 ENCOUNTER — Inpatient Hospital Stay: Payer: Medicare HMO | Attending: Internal Medicine

## 2021-10-08 ENCOUNTER — Inpatient Hospital Stay (HOSPITAL_BASED_OUTPATIENT_CLINIC_OR_DEPARTMENT_OTHER): Payer: Medicare HMO | Admitting: Medical Oncology

## 2021-10-08 ENCOUNTER — Ambulatory Visit: Payer: Medicare HMO | Admitting: Internal Medicine

## 2021-10-08 VITALS — BP 111/70 | HR 63 | Temp 98.5°F | Resp 16 | Wt 125.0 lb

## 2021-10-08 DIAGNOSIS — Z79811 Long term (current) use of aromatase inhibitors: Secondary | ICD-10-CM | POA: Diagnosis not present

## 2021-10-08 DIAGNOSIS — C50812 Malignant neoplasm of overlapping sites of left female breast: Secondary | ICD-10-CM

## 2021-10-08 DIAGNOSIS — Z923 Personal history of irradiation: Secondary | ICD-10-CM | POA: Diagnosis not present

## 2021-10-08 DIAGNOSIS — Z170 Estrogen receptor positive status [ER+]: Secondary | ICD-10-CM | POA: Insufficient documentation

## 2021-10-08 DIAGNOSIS — Z87891 Personal history of nicotine dependence: Secondary | ICD-10-CM | POA: Diagnosis not present

## 2021-10-08 LAB — CBC WITH DIFFERENTIAL/PLATELET
Abs Immature Granulocytes: 0.01 10*3/uL (ref 0.00–0.07)
Basophils Absolute: 0.1 10*3/uL (ref 0.0–0.1)
Basophils Relative: 2 %
Eosinophils Absolute: 0.2 10*3/uL (ref 0.0–0.5)
Eosinophils Relative: 4 %
HCT: 39.9 % (ref 36.0–46.0)
Hemoglobin: 13.1 g/dL (ref 12.0–15.0)
Immature Granulocytes: 0 %
Lymphocytes Relative: 15 %
Lymphs Abs: 0.8 10*3/uL (ref 0.7–4.0)
MCH: 31.7 pg (ref 26.0–34.0)
MCHC: 32.8 g/dL (ref 30.0–36.0)
MCV: 96.6 fL (ref 80.0–100.0)
Monocytes Absolute: 0.6 10*3/uL (ref 0.1–1.0)
Monocytes Relative: 11 %
Neutro Abs: 3.3 10*3/uL (ref 1.7–7.7)
Neutrophils Relative %: 68 %
Platelets: 220 10*3/uL (ref 150–400)
RBC: 4.13 MIL/uL (ref 3.87–5.11)
RDW: 12.7 % (ref 11.5–15.5)
WBC: 5 10*3/uL (ref 4.0–10.5)
nRBC: 0 % (ref 0.0–0.2)

## 2021-10-08 LAB — COMPREHENSIVE METABOLIC PANEL
ALT: 15 U/L (ref 0–44)
AST: 25 U/L (ref 15–41)
Albumin: 4 g/dL (ref 3.5–5.0)
Alkaline Phosphatase: 60 U/L (ref 38–126)
Anion gap: 7 (ref 5–15)
BUN: 24 mg/dL — ABNORMAL HIGH (ref 8–23)
CO2: 27 mmol/L (ref 22–32)
Calcium: 9 mg/dL (ref 8.9–10.3)
Chloride: 105 mmol/L (ref 98–111)
Creatinine, Ser: 0.71 mg/dL (ref 0.44–1.00)
GFR, Estimated: >60 mL/min (ref >60)
Glucose, Bld: 96 mg/dL (ref 70–99)
Potassium: 4.1 mmol/L (ref 3.5–5.1)
Sodium: 139 mmol/L (ref 135–145)
Total Bilirubin: 0.6 mg/dL (ref 0.3–1.2)
Total Protein: 7.3 g/dL (ref 6.5–8.1)

## 2021-10-08 NOTE — Progress Notes (Signed)
[facility] NOTE  Patient Care Team: Derinda Late, MD as PCP - General (Family Medicine)  CHIEF COMPLAINTS/PURPOSE OF CONSULTATION: Breast cancer  #  Oncology History Overview Note  # 2017- LEFT BREAST CANCER ER POSITIVE; Her-2 NEG [s/p lumpectomy]; s/p RT; no chemo; ?  [[place]]  #Anastrozole [dec 1308]  # SURVIVORSHIP:   # GENETICS: declined because of expense/ insurance; july2021-s/p counseling; declines work up  DIAGNOSIS: Left breast cancer  STAGE: Early stage        ;  GOALS: cure  CURRENT/MOST RECENT THERAPY : on arimidex     Malignant neoplasm of left breast in female, estrogen receptor positive ([place]) (Resolved)  09/06/2019 Initial Diagnosis   Malignant neoplasm of left breast in female, estrogen receptor positive ([place])   Carcinoma of overlapping sites of left breast in female, estrogen receptor positive ([place])  10/01/2019 Initial Diagnosis   Carcinoma of overlapping sites of left breast in female, estrogen receptor positive ([place])      HISTORY OF PRESENTING ILLNESS: Ambulating independently.  Alone. Tina Armstrong 76 y.o.  female prior history of breast cancer -early stage ER/PR positive as per history on anastrozole is here for follow-up.  Patient reports that she continues to feel well. Taking her Arimidex as prescribed. She does note some chronic pain of the left breast but notes no other changes. Her last mammogram was in July 2022 which was negative for any acute abnormality. She has a diagnostic mammogram and [place] on July scheduled. Now on a weekly medication for osteoporosis per pt. Monitored and managed by Dr. Lovie Macadamia.    Review of Systems  Constitutional:  Negative for chills, diaphoresis, fever, malaise/fatigue and weight loss.  HENT:  Negative for nosebleeds and sore throat.   Eyes:  Negative for double vision.  Respiratory:  Negative for cough, hemoptysis, sputum production, shortness of breath and wheezing.   Cardiovascular:   Negative for chest pain, palpitations, orthopnea and leg swelling.  Gastrointestinal:  Negative for abdominal pain, blood in stool, constipation, diarrhea, heartburn, melena, nausea and vomiting.  Genitourinary:  Negative for dysuria, frequency and urgency.  Musculoskeletal:  Negative for back pain and joint pain.  Skin: Negative.  Negative for itching and rash.  Neurological:  Negative for dizziness, tingling, focal weakness, weakness and headaches.  Endo/Heme/Allergies:  Does not bruise/bleed easily.  Psychiatric/Behavioral:  Negative for depression. The patient is not nervous/anxious and does not have insomnia.      MEDICAL HISTORY:  Past Medical History:  Diagnosis Date   Anxiety    Family history of breast cancer    History of cancer    breast   Hyperlipemia    Mitral valve prolapse    Osteoporosis    Personal history of radiation therapy    breast   Seizures ([place])    Thyroid disease     SURGICAL HISTORY: Past Surgical History:  Procedure Laterality Date   ABDOMINAL HYSTERECTOMY     total   APPENDECTOMY     BREAST EXCISIONAL BIOPSY Left 1995   BREAST LUMPECTOMY Left 2016   MASTECTOMY PARTIAL / LUMPECTOMY Left 04/10/2016   MASTECTOMY, PARTIAL Left 04/12/2015    SOCIAL HISTORY: Social History   Socioeconomic History   Marital status: Widowed    Spouse name: Not on file   Number of children: Not on file   Years of education: Not on file   Highest education level: Not on file  Occupational History   Not on file  Tobacco Use   Smoking  status: Former    Types: Cigarettes    Start date: 2002    Quit date: 2007    Years since quitting: 16.5   Smokeless tobacco: Never  Substance and Sexual Activity   Alcohol use: Yes    Alcohol/week: 1.0 standard drink of alcohol    Types: 1 Glasses of wine per week   Drug use: Never   Sexual activity: Not on file  Other Topics Concern   Not on file  Social History Narrative   Moved from [place] at age of 69 years; moved  from [place] to [place] for family reasons- lives in [place] daughter.  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    Social Determinants of Health   Financial Resource Strain: Not on file  Food Insecurity: Not on file  Transportation Needs: Not on file  Physical Activity: Not on file  Stress: Not on file  Social Connections: Not on file  Intimate Partner Violence: Not on file    FAMILY HISTORY: Family History  Problem Relation Age of Onset   Breast cancer Mother 8   Heart attack Father    Heart attack Brother    Osteoporosis Paternal Grandmother    Heart attack Brother    Heart attack Brother     ALLERGIES:  is allergic to atenolol and diltiazem.  MEDICATIONS:  Current Outpatient Medications  Medication Sig Dispense Refill   anastrozole (ARIMIDEX) 1 MG tablet TAKE 1 TABLET EVERY DAY 90 tablet 1   aspirin EC 81 MG tablet Take 81 mg by mouth daily. Swallow whole.     atorvastatin (LIPITOR) 20 MG tablet Take 1 tablet by mouth daily.     Calcium Carb-Cholecalciferol (CALCIUM 1000 + D PO) Take 1 tablet by mouth daily.     cyanocobalamin 1000 MCG tablet Take 1,000 mcg by mouth daily.     ferrous sulfate 325 (65 FE) MG tablet Take 325 mg by mouth daily.     gabapentin (NEURONTIN) 300 MG capsule Take 300 mg by mouth daily.     levothyroxine (SYNTHROID) 100 MCG tablet Take 100 mcg by mouth daily.     Potassium Gluconate 2.5 MEQ TABS Take 2.5 mEq by mouth daily.     zinc gluconate 50 MG tablet Take 50 mg by mouth daily.     Cholecalciferol 25 MCG (1000 UT) capsule Take 1,000 Units by mouth daily. (Patient not taking: Reported on 10/08/2021)     pantoprazole (PROTONIX) 20 MG tablet Take 20 mg by mouth daily. (Patient not taking: Reported on 10/08/2021)     No current facility-administered medications for this visit.      Marland Kitchen  PHYSICAL EXAMINATION: ECOG PERFORMANCE STATUS: 0 - Asymptomatic  Vitals:   10/08/21 1410  BP: 111/70  Pulse: 63  Resp: 16  Temp: 98.5 F  (36.9 C)   Filed Weights   10/08/21 1407  Weight: 125 lb (56.7 kg)    Physical Exam HENT:     Head: Normocephalic and atraumatic.     Mouth/Throat:     Pharynx: No oropharyngeal exudate.  Eyes:     Pupils: Pupils are equal, round, and reactive to light.  Cardiovascular:     Rate and Rhythm: Normal rate and regular rhythm.  Pulmonary:     Effort: Pulmonary effort is normal. No respiratory distress.     Breath sounds: Normal breath sounds. No wheezing.  Abdominal:     General: Bowel sounds are normal. There is no distension.     Palpations:  Abdomen is soft. There is no mass.     Tenderness: There is no abdominal tenderness. There is no guarding or rebound.  Musculoskeletal:        General: No tenderness. Normal range of motion.     Cervical back: Normal range of motion and neck supple.  Skin:    General: Skin is warm.     Comments: Right and left BREAST exam [in the presence of nurse]- no unusual skin changes or dominant masses felt. Surgical scars noted.    Neurological:     Mental Status: She is alert and oriented to person, place, and time.  Psychiatric:        Mood and Affect: Affect normal.      LABORATORY DATA:  I have reviewed the data as listed Lab Results  Component Value Date   WBC 5.0 10/08/2021   HGB 13.1 10/08/2021   HCT 39.9 10/08/2021   MCV 96.6 10/08/2021   PLT 220 10/08/2021   Recent Labs    04/09/21 1326 10/08/21 1332  NA 136 139  K 3.6 4.1  CL 99 105  CO2 26 27  GLUCOSE 87 96  BUN 29* 24*  CREATININE 0.75 0.71  CALCIUM 9.3 9.0  GFRNONAA >60 >60  PROT 7.2 7.3  ALBUMIN 4.0 4.0  AST 59* 25  ALT 57* 15  ALKPHOS 72 60  BILITOT 0.4 0.6     RADIOGRAPHIC STUDIES: I have personally reviewed the radiological images as listed and agreed with the findings in the report. No results found.  ASSESSMENT & PLAN:   No problem-specific Assessment & Plan notes found for this encounter.  Encounter Diagnosis  Name Primary?   Carcinoma of  overlapping sites of left breast in female, estrogen receptor positive ([place]) Yes   Chronic in nature. Patient appears to be doing well. Continue monitoring and follow up with PCP. Follow up in 6- months per pt preference with MD.     All questions were answered. The patient/family knows to call the clinic with any problems, questions or concerns.    Hughie Closs, PA-C 10/08/2021 3:38 PM

## 2021-10-08 NOTE — Progress Notes (Signed)
Returns for 90mofollow-up. Denies any new concerns or changes.

## 2021-10-27 DIAGNOSIS — E785 Hyperlipidemia, unspecified: Secondary | ICD-10-CM | POA: Diagnosis not present

## 2021-10-27 DIAGNOSIS — G2581 Restless legs syndrome: Secondary | ICD-10-CM | POA: Diagnosis not present

## 2021-10-27 DIAGNOSIS — C50912 Malignant neoplasm of unspecified site of left female breast: Secondary | ICD-10-CM | POA: Diagnosis not present

## 2021-10-27 DIAGNOSIS — M21611 Bunion of right foot: Secondary | ICD-10-CM | POA: Diagnosis not present

## 2021-10-27 DIAGNOSIS — E039 Hypothyroidism, unspecified: Secondary | ICD-10-CM | POA: Diagnosis not present

## 2021-11-01 ENCOUNTER — Ambulatory Visit
Admission: RE | Admit: 2021-11-01 | Discharge: 2021-11-01 | Disposition: A | Discharge status: Home / Self Care | Payer: Medicare HMO | Source: Ambulatory Visit | Attending: Family Medicine | Admitting: Family Medicine

## 2021-11-01 DIAGNOSIS — R921 Mammographic calcification found on diagnostic imaging of breast: Secondary | ICD-10-CM | POA: Insufficient documentation

## 2021-11-01 DIAGNOSIS — Z853 Personal history of malignant neoplasm of breast: Secondary | ICD-10-CM | POA: Diagnosis not present

## 2021-11-01 DIAGNOSIS — R928 Other abnormal and inconclusive findings on diagnostic imaging of breast: Secondary | ICD-10-CM | POA: Diagnosis not present

## 2021-11-01 DIAGNOSIS — Z1231 Encounter for screening mammogram for malignant neoplasm of breast: Secondary | ICD-10-CM | POA: Diagnosis not present

## 2021-11-04 DIAGNOSIS — M9901 Segmental and somatic dysfunction of cervical region: Secondary | ICD-10-CM | POA: Diagnosis not present

## 2021-11-04 DIAGNOSIS — M9902 Segmental and somatic dysfunction of thoracic region: Secondary | ICD-10-CM | POA: Diagnosis not present

## 2021-11-04 DIAGNOSIS — M5033 Other cervical disc degeneration, cervicothoracic region: Secondary | ICD-10-CM | POA: Diagnosis not present

## 2021-11-04 DIAGNOSIS — M5412 Radiculopathy, cervical region: Secondary | ICD-10-CM | POA: Diagnosis not present

## 2021-11-17 DIAGNOSIS — E7800 Pure hypercholesterolemia, unspecified: Secondary | ICD-10-CM | POA: Diagnosis not present

## 2021-11-17 DIAGNOSIS — R002 Palpitations: Secondary | ICD-10-CM | POA: Diagnosis not present

## 2021-11-17 DIAGNOSIS — R001 Bradycardia, unspecified: Secondary | ICD-10-CM | POA: Diagnosis not present

## 2021-11-20 ENCOUNTER — Other Ambulatory Visit: Payer: Self-pay | Admitting: Internal Medicine

## 2021-12-16 DIAGNOSIS — M5033 Other cervical disc degeneration, cervicothoracic region: Secondary | ICD-10-CM | POA: Diagnosis not present

## 2021-12-16 DIAGNOSIS — M9902 Segmental and somatic dysfunction of thoracic region: Secondary | ICD-10-CM | POA: Diagnosis not present

## 2021-12-16 DIAGNOSIS — M9901 Segmental and somatic dysfunction of cervical region: Secondary | ICD-10-CM | POA: Diagnosis not present

## 2021-12-16 DIAGNOSIS — M5412 Radiculopathy, cervical region: Secondary | ICD-10-CM | POA: Diagnosis not present

## 2022-01-27 DIAGNOSIS — M9902 Segmental and somatic dysfunction of thoracic region: Secondary | ICD-10-CM | POA: Diagnosis not present

## 2022-01-27 DIAGNOSIS — M9901 Segmental and somatic dysfunction of cervical region: Secondary | ICD-10-CM | POA: Diagnosis not present

## 2022-01-27 DIAGNOSIS — M5033 Other cervical disc degeneration, cervicothoracic region: Secondary | ICD-10-CM | POA: Diagnosis not present

## 2022-01-27 DIAGNOSIS — M5412 Radiculopathy, cervical region: Secondary | ICD-10-CM | POA: Diagnosis not present

## 2022-02-07 ENCOUNTER — Encounter (INDEPENDENT_AMBULATORY_CARE_PROVIDER_SITE_OTHER): Payer: Self-pay

## 2022-02-28 DIAGNOSIS — J069 Acute upper respiratory infection, unspecified: Secondary | ICD-10-CM | POA: Diagnosis not present

## 2022-03-10 DIAGNOSIS — M9902 Segmental and somatic dysfunction of thoracic region: Secondary | ICD-10-CM | POA: Diagnosis not present

## 2022-03-10 DIAGNOSIS — M5412 Radiculopathy, cervical region: Secondary | ICD-10-CM | POA: Diagnosis not present

## 2022-03-10 DIAGNOSIS — M5033 Other cervical disc degeneration, cervicothoracic region: Secondary | ICD-10-CM | POA: Diagnosis not present

## 2022-03-10 DIAGNOSIS — M9901 Segmental and somatic dysfunction of cervical region: Secondary | ICD-10-CM | POA: Diagnosis not present

## 2022-03-23 DIAGNOSIS — Y92009 Unspecified place in unspecified non-institutional (private) residence as the place of occurrence of the external cause: Secondary | ICD-10-CM | POA: Diagnosis not present

## 2022-03-23 DIAGNOSIS — M546 Pain in thoracic spine: Secondary | ICD-10-CM | POA: Diagnosis not present

## 2022-03-23 DIAGNOSIS — W010XXA Fall on same level from slipping, tripping and stumbling without subsequent striking against object, initial encounter: Secondary | ICD-10-CM | POA: Diagnosis not present

## 2022-03-24 DIAGNOSIS — W010XXA Fall on same level from slipping, tripping and stumbling without subsequent striking against object, initial encounter: Secondary | ICD-10-CM | POA: Diagnosis not present

## 2022-03-24 DIAGNOSIS — Y92009 Unspecified place in unspecified non-institutional (private) residence as the place of occurrence of the external cause: Secondary | ICD-10-CM | POA: Diagnosis not present

## 2022-03-24 DIAGNOSIS — M546 Pain in thoracic spine: Secondary | ICD-10-CM | POA: Diagnosis not present

## 2022-03-30 DIAGNOSIS — H2513 Age-related nuclear cataract, bilateral: Secondary | ICD-10-CM | POA: Diagnosis not present

## 2022-04-14 DIAGNOSIS — M5033 Other cervical disc degeneration, cervicothoracic region: Secondary | ICD-10-CM | POA: Diagnosis not present

## 2022-04-14 DIAGNOSIS — M9902 Segmental and somatic dysfunction of thoracic region: Secondary | ICD-10-CM | POA: Diagnosis not present

## 2022-04-14 DIAGNOSIS — M9901 Segmental and somatic dysfunction of cervical region: Secondary | ICD-10-CM | POA: Diagnosis not present

## 2022-04-14 DIAGNOSIS — M5412 Radiculopathy, cervical region: Secondary | ICD-10-CM | POA: Diagnosis not present

## 2022-04-15 ENCOUNTER — Inpatient Hospital Stay: Payer: Medicare HMO | Attending: Internal Medicine | Admitting: Internal Medicine

## 2022-04-15 VITALS — BP 116/79 | HR 54 | Temp 96.9°F | Resp 16 | Wt 126.3 lb

## 2022-04-15 DIAGNOSIS — Z79811 Long term (current) use of aromatase inhibitors: Secondary | ICD-10-CM | POA: Diagnosis not present

## 2022-04-15 DIAGNOSIS — C50812 Malignant neoplasm of overlapping sites of left female breast: Secondary | ICD-10-CM

## 2022-04-15 DIAGNOSIS — Z170 Estrogen receptor positive status [ER+]: Secondary | ICD-10-CM

## 2022-04-15 DIAGNOSIS — Z87891 Personal history of nicotine dependence: Secondary | ICD-10-CM | POA: Insufficient documentation

## 2022-04-15 DIAGNOSIS — Z923 Personal history of irradiation: Secondary | ICD-10-CM | POA: Diagnosis not present

## 2022-04-15 DIAGNOSIS — D0512 Intraductal carcinoma in situ of left breast: Secondary | ICD-10-CM | POA: Diagnosis not present

## 2022-04-15 NOTE — Assessment & Plan Note (Deleted)
#   Left breast cancer ER positive; early stage [2017; as reported by patient; no records available].  S/p radiation.  STABLE;  Recommended 5 years of Arimidex [started dec 2018].   #Clinical exam no evidence of recurrence.  Mammogram  JUly 2022-coarse calcification lumpectomy scar/fat necrosis likely.  Awaiting repeat mammogram in 1 year.  # Osteoporosis [as per patient]/ BMD-currently on? Pill; previously on "drip" once a year.  Awaiting  bone density with PCP-Dr.Baboff  Continue calcium plus vitamin D.  #Unfortunately-no records available yet;Hospital in [place]. .  She states she lost information on her home computer.  Signed a release.  We will try to obtain records..  # DISPOSITION:  # follow up in 6 months- MD; labs- cbc/cmp-Dr.B

## 2022-04-15 NOTE — Progress Notes (Signed)
one [facility] NOTE  Patient Care Team: Derinda Late, MD as PCP - General (Family Medicine)  CHIEF COMPLAINTS/PURPOSE OF CONSULTATION: Breast cancer  #  Oncology History Overview Note  # 2016- LEFT BREAST DCIS ER POSITIVE; Her-2 NEG [s/p lumpectomy]; s/p RT;  [[place]]; AUG 2016- anastrazole; discontinued January 2024 [given unavailability of records from [place]  #    Malignant neoplasm of left breast in female, estrogen receptor positive ([place]) (Resolved)  09/06/2019 Initial Diagnosis   Malignant neoplasm of left breast in female, estrogen receptor positive ([place])   Ductal carcinoma in situ (DCIS) of left breast  04/15/2022 Initial Diagnosis   Ductal carcinoma in situ (DCIS) of left breast   04/15/2022 Cancer Staging   Staging form: Breast, AJCC 8th Edition - Clinical: Stage 0 (cTis (DCIS), cN0, cM0) - Signed by Cammie Sickle, MD on 04/15/2022     HISTORY OF PRESENTING ILLNESS: Ambulating independently.  Alone.  Tina Armstrong 77 y.o.  female left breast DCIS [2016] on anastrozole is here for follow-up.  Finally received and reviewed records from [place].   Patient does not have invasive cancer; she has history of DCIS.  She continues with anastrozole.  Denies any complaints. Otherwise no lumps or bumps anywhere else.  No nausea no vomiting no headaches.  No worsening joint pains or bone pain.  Review of Systems  Constitutional:  Negative for chills, diaphoresis, fever, malaise/fatigue and weight loss.  HENT:  Negative for nosebleeds and sore throat.   Eyes:  Negative for double vision.  Respiratory:  Negative for cough, hemoptysis, sputum production, shortness of breath and wheezing.   Cardiovascular:  Negative for chest pain, palpitations, orthopnea and leg swelling.  Gastrointestinal:  Negative for abdominal pain, blood in stool, constipation, diarrhea, heartburn, melena, nausea and vomiting.  Genitourinary:  Negative for dysuria, frequency and  urgency.  Musculoskeletal:  Negative for back pain and joint pain.  Skin: Negative.  Negative for itching and rash.  Neurological:  Negative for dizziness, tingling, focal weakness, weakness and headaches.  Endo/Heme/Allergies:  Does not bruise/bleed easily.  Psychiatric/Behavioral:  Negative for depression. The patient is not nervous/anxious and does not have insomnia.      MEDICAL HISTORY:  Past Medical History:  Diagnosis Date   Anxiety    Family history of breast cancer    History of cancer    breast   Hyperlipemia    Mitral valve prolapse    Osteoporosis    Personal history of radiation therapy    breast   Seizures ([place])    Thyroid disease     SURGICAL HISTORY: Past Surgical History:  Procedure Laterality Date   ABDOMINAL HYSTERECTOMY     total   APPENDECTOMY     BREAST EXCISIONAL BIOPSY Left 1995   BREAST LUMPECTOMY Left 2016   MASTECTOMY PARTIAL / LUMPECTOMY Left 04/10/2016   MASTECTOMY, PARTIAL Left 04/12/2015    SOCIAL HISTORY: Social History   Socioeconomic History   Marital status: Widowed    Spouse name: Not on file   Number of children: Not on file   Years of education: Not on file   Highest education level: Not on file  Occupational History   Not on file  Tobacco Use   Smoking status: Former    Types: Cigarettes    Start date: 2002    Quit date: 2007    Years since quitting: 17.0   Smokeless tobacco: Never  Substance and Sexual Activity   Alcohol use: Yes  Alcohol/week: 1.0 standard drink of alcohol    Types: 1 Glasses of wine per week   Drug use: Never   Sexual activity: Not on file  Other Topics Concern   Not on file  Social History Narrative   Moved from [place] at age of 12 years; moved from [place] to [place] for family reasons- lives in [place] daughter.  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    Social Determinants of Health   Financial Resource Strain: Not on file  Food Insecurity: Not on file   Transportation Needs: Not on file  Physical Activity: Not on file  Stress: Not on file  Social Connections: Not on file  Intimate Partner Violence: Not on file    FAMILY HISTORY: Family History  Problem Relation Age of Onset   Breast cancer Mother 2   Heart attack Father    Heart attack Brother    Osteoporosis Paternal Grandmother    Heart attack Brother    Heart attack Brother     ALLERGIES:  is allergic to atenolol and diltiazem.  MEDICATIONS:  Current Outpatient Medications  Medication Sig Dispense Refill   alendronate (FOSAMAX) 70 MG tablet Take 70 mg by mouth once a week.     anastrozole (ARIMIDEX) 1 MG tablet TAKE 1 TABLET EVERY DAY 90 tablet 0   aspirin EC 81 MG tablet Take 81 mg by mouth daily. Swallow whole.     atorvastatin (LIPITOR) 20 MG tablet Take 1 tablet by mouth daily.     Calcium Carb-Cholecalciferol (CALCIUM 1000 + D PO) Take 1 tablet by mouth daily.     cyanocobalamin 1000 MCG tablet Take 1,000 mcg by mouth daily.     ferrous sulfate 325 (65 FE) MG tablet Take 325 mg by mouth daily.     gabapentin (NEURONTIN) 300 MG capsule Take 300 mg by mouth daily.     levothyroxine (SYNTHROID) 100 MCG tablet Take 100 mcg by mouth daily.     Potassium Gluconate 2.5 MEQ TABS Take 2.5 mEq by mouth daily.     zinc gluconate 50 MG tablet Take 50 mg by mouth daily.     Cholecalciferol 25 MCG (1000 UT) capsule Take 1,000 Units by mouth daily. (Patient not taking: Reported on 10/08/2021)     pantoprazole (PROTONIX) 20 MG tablet Take 20 mg by mouth daily. (Patient not taking: Reported on 10/08/2021)     No current facility-administered medications for this visit.      Right and left BREAST exam [in the presence of nurse]- no unusual skin changes or dominant masses felt. Surgical scars noted.    PHYSICAL EXAMINATION: ECOG PERFORMANCE STATUS: 0 - Asymptomatic  Vitals:   04/15/22 1400  BP: 116/79  Pulse: (!) 54  Resp: 16  Temp: (!) 96.9 F (36.1 C)   Filed Weights    04/15/22 1400  Weight: 126 lb 4.8 oz (57.3 kg)    Physical Exam HENT:     Head: Normocephalic and atraumatic.     Mouth/Throat:     Pharynx: No oropharyngeal exudate.  Eyes:     Pupils: Pupils are equal, round, and reactive to light.  Cardiovascular:     Rate and Rhythm: Normal rate and regular rhythm.  Pulmonary:     Effort: Pulmonary effort is normal. No respiratory distress.     Breath sounds: Normal breath sounds. No wheezing.  Abdominal:     General: Bowel sounds are normal. There is no distension.     Palpations: Abdomen  is soft. There is no mass.     Tenderness: There is no abdominal tenderness. There is no guarding or rebound.  Musculoskeletal:        General: No tenderness. Normal range of motion.     Cervical back: Normal range of motion and neck supple.  Skin:    General: Skin is warm.     Comments: Right and left BREAST exam [in the presence of nurse]- no unusual skin changes or dominant masses felt. Surgical scars noted.    Neurological:     Mental Status: She is alert and oriented to person, place, and time.  Psychiatric:        Mood and Affect: Affect normal.      LABORATORY DATA:  I have reviewed the data as listed Lab Results  Component Value Date   WBC 5.0 10/08/2021   HGB 13.1 10/08/2021   HCT 39.9 10/08/2021   MCV 96.6 10/08/2021   PLT 220 10/08/2021   Recent Labs    10/08/21 1332  NA 139  K 4.1  CL 105  CO2 27  GLUCOSE 96  BUN 24*  CREATININE 0.71  CALCIUM 9.0  GFRNONAA >60  PROT 7.3  ALBUMIN 4.0  AST 25  ALT 15  ALKPHOS 60  BILITOT 0.6    RADIOGRAPHIC STUDIES: I have personally reviewed the radiological images as listed and agreed with the findings in the report. No results found.  ASSESSMENT & PLAN:   Ductal carcinoma in situ (DCIS) of left breast # Left breast DCIS- ER positive; early stage [2016; patient initially reported as invasive cancer no records available].  However after review of records patient noted to have  left breast DCIS.  S/p radiation. Arimidex [started AUG 2016; [place]].  Discontinue anastrozole.   # Clinical exam no evidence of recurrence. JULY 2023 [Dr.Bronstein]- BIL screening- WNL.   # Osteoporosis [as per patient]/ BMD-currently on? Pill; previously on "drip" once a year.  Awaiting  bone density with PCP-Dr.Baboff  Continue calcium plus vitamin D.  # Genetics: s/p genetics evaluation; pt is interested; will inform re: testing.   # DISPOSITION:  # follow up in 12  months- MD; no labs- -Dr.B  All questions were answered. The patient/family knows to call the clinic with any problems, questions or concerns.    Cammie Sickle, MD 04/15/2022 2:53 PM

## 2022-04-15 NOTE — Assessment & Plan Note (Addendum)
#   Left breast DCIS- ER positive; early stage [2016; patient initially reported as invasive cancer no records available].  However after review of records patient noted to have left breast DCIS.  S/p radiation. Arimidex [started AUG 2016; [place]].  Discontinue anastrozole.   # Clinical exam no evidence of recurrence. JULY 2023 [Dr.Bronstein]- BIL screening- WNL.   # Osteoporosis [as per patient]/ BMD-Fosamax [per PCP].  Continue calcium plus vitamin D.  # Genetics: s/p genetics evaluation; pt is interested; will inform re: testing.   # DISPOSITION:  # follow up in 12  months- MD; no labs- -Dr.B

## 2022-04-15 NOTE — Progress Notes (Signed)
Patient wants to now when she can stop Anastrozole.

## 2022-04-16 ENCOUNTER — Other Ambulatory Visit: Payer: Self-pay | Admitting: Internal Medicine

## 2022-04-18 ENCOUNTER — Telehealth: Payer: Self-pay | Admitting: Licensed Clinical Social Worker

## 2022-04-18 NOTE — Telephone Encounter (Signed)
Per last MD note, discontinue anastrozole.

## 2022-04-18 NOTE — Telephone Encounter (Signed)
Left message for patient that we can schedule lab appointment to have genetic testing done. Left my number and requested she call me back.

## 2022-04-22 DIAGNOSIS — R413 Other amnesia: Secondary | ICD-10-CM | POA: Diagnosis not present

## 2022-04-29 DIAGNOSIS — Z853 Personal history of malignant neoplasm of breast: Secondary | ICD-10-CM | POA: Diagnosis not present

## 2022-04-29 DIAGNOSIS — E785 Hyperlipidemia, unspecified: Secondary | ICD-10-CM | POA: Diagnosis not present

## 2022-04-29 DIAGNOSIS — Z0000 Encounter for general adult medical examination without abnormal findings: Secondary | ICD-10-CM | POA: Diagnosis not present

## 2022-04-29 DIAGNOSIS — E039 Hypothyroidism, unspecified: Secondary | ICD-10-CM | POA: Diagnosis not present

## 2022-04-29 DIAGNOSIS — Z1211 Encounter for screening for malignant neoplasm of colon: Secondary | ICD-10-CM | POA: Diagnosis not present

## 2022-04-29 DIAGNOSIS — G2581 Restless legs syndrome: Secondary | ICD-10-CM | POA: Diagnosis not present

## 2022-04-29 DIAGNOSIS — M810 Age-related osteoporosis without current pathological fracture: Secondary | ICD-10-CM | POA: Diagnosis not present

## 2022-04-29 DIAGNOSIS — Z1331 Encounter for screening for depression: Secondary | ICD-10-CM | POA: Diagnosis not present

## 2022-05-04 ENCOUNTER — Emergency Department: Payer: Medicare HMO

## 2022-05-04 ENCOUNTER — Emergency Department
Admission: EM | Admit: 2022-05-04 | Discharge: 2022-05-04 | Disposition: A | Discharge status: Home / Self Care | Payer: Medicare HMO | Attending: Emergency Medicine | Admitting: Emergency Medicine

## 2022-05-04 ENCOUNTER — Ambulatory Visit
Admission: EM | Admit: 2022-05-04 | Discharge: 2022-05-04 | Disposition: A | Discharge status: Home / Self Care | Payer: Medicare HMO

## 2022-05-04 DIAGNOSIS — S0990XA Unspecified injury of head, initial encounter: Secondary | ICD-10-CM | POA: Diagnosis not present

## 2022-05-04 DIAGNOSIS — M7989 Other specified soft tissue disorders: Secondary | ICD-10-CM | POA: Diagnosis not present

## 2022-05-04 DIAGNOSIS — M79645 Pain in left finger(s): Secondary | ICD-10-CM | POA: Diagnosis not present

## 2022-05-04 DIAGNOSIS — M79642 Pain in left hand: Secondary | ICD-10-CM | POA: Diagnosis present

## 2022-05-04 DIAGNOSIS — M47812 Spondylosis without myelopathy or radiculopathy, cervical region: Secondary | ICD-10-CM | POA: Insufficient documentation

## 2022-05-04 DIAGNOSIS — R220 Localized swelling, mass and lump, head: Secondary | ICD-10-CM | POA: Diagnosis not present

## 2022-05-04 DIAGNOSIS — S62325A Displaced fracture of shaft of fourth metacarpal bone, left hand, initial encounter for closed fracture: Secondary | ICD-10-CM | POA: Insufficient documentation

## 2022-05-04 DIAGNOSIS — Y92009 Unspecified place in unspecified non-institutional (private) residence as the place of occurrence of the external cause: Secondary | ICD-10-CM | POA: Diagnosis not present

## 2022-05-04 DIAGNOSIS — S0083XA Contusion of other part of head, initial encounter: Secondary | ICD-10-CM

## 2022-05-04 DIAGNOSIS — T1490XA Injury, unspecified, initial encounter: Secondary | ICD-10-CM | POA: Diagnosis not present

## 2022-05-04 DIAGNOSIS — W19XXXA Unspecified fall, initial encounter: Secondary | ICD-10-CM

## 2022-05-04 DIAGNOSIS — W0110XA Fall on same level from slipping, tripping and stumbling with subsequent striking against unspecified object, initial encounter: Secondary | ICD-10-CM | POA: Diagnosis not present

## 2022-05-04 DIAGNOSIS — R0781 Pleurodynia: Secondary | ICD-10-CM | POA: Diagnosis not present

## 2022-05-04 DIAGNOSIS — Y92480 Sidewalk as the place of occurrence of the external cause: Secondary | ICD-10-CM | POA: Insufficient documentation

## 2022-05-04 DIAGNOSIS — S0012XA Contusion of left eyelid and periocular area, initial encounter: Secondary | ICD-10-CM | POA: Diagnosis not present

## 2022-05-04 DIAGNOSIS — R519 Headache, unspecified: Secondary | ICD-10-CM | POA: Diagnosis not present

## 2022-05-04 DIAGNOSIS — Y9301 Activity, walking, marching and hiking: Secondary | ICD-10-CM | POA: Diagnosis not present

## 2022-05-04 MED ORDER — OXYCODONE-ACETAMINOPHEN 5-325 MG PO TABS: 1.0000 | ORAL_TABLET | Freq: Four times a day (QID) | ORAL | 0 refills | Status: DC | PRN

## 2022-05-04 NOTE — ED Notes (Signed)
PA, Roderic Palau at bedside; update provided to patient and family.

## 2022-05-04 NOTE — ED Provider Notes (Signed)
[facility] Provider Note  Patient Contact: 7:14 PM (approximate)   History   Fall   HPI  Tina Armstrong is a 77 y.o. female who presents emergency department after mechanical fall.  Patient was walking into her garage when she caught her foot causing her to fall.  Patient landed mostly on her left side.  She did hit her head but no loss of consciousness.  Patient arrives with bruising and edema around the left eye, left hand pain and rib pain.  No subsequent loss consciousness.  No visual changes, chest pain, shortness of breath.  No unilateral weakness.  Given fall was mechanical in nature.     Physical Exam   Triage Vital Signs: ED Triage Vitals  Enc Vitals Group     BP 05/04/22 1653 119/62     Pulse Rate 05/04/22 1653 62     Resp 05/04/22 1653 16     Temp 05/04/22 1653 97.7 F (36.5 C)     Temp Source 05/04/22 1653 Oral     SpO2 05/04/22 1653 98 %     Weight 05/04/22 1651 125 lb 10.6 oz (57 kg)     Height --      Head Circumference --      Peak Flow --      Pain Score 05/04/22 1651 0     Pain Loc --      Pain Edu? --      Excl. in [place]? --     Most recent vital signs: Vitals:   05/04/22 1653  BP: 119/62  Pulse: 62  Resp: 16  Temp: 97.7 F (36.5 C)  SpO2: 98%     General: Alert and in no acute distress. Eyes:  PERRL. EOMI. Head: Edema and ecchymosis about the left orbital region.  Area is tender to palpation.  No palpable abnormality or crepitus.  No other visible signs of trauma.  No open wounds.  Area is tender to palpation.  Neck: No stridor. No cervical spine tenderness to palpation.  Cardiovascular:  Good peripheral perfusion Respiratory: Normal respiratory effort without tachypnea or retractions. Lungs CTAB. Good air entry to the bases with no decreased or absent breath sounds Musculoskeletal: Full range of motion to all extremities.  Visualization of the left hand reveals edema and ecchymosis to the hand mostly over the fourth  and fifth metacarpal region.  This does extend into the fourth digit itself.  Tender to palpation over the metacarpal without palpable abnormality.  Still able to flex and extend the digit at this time.  Sensation and cap refill intact all digits. Neurologic:  No gross focal neurologic deficits are appreciated.  Skin:   No rash noted Other:   ED Results / Procedures / Treatments   Labs (all labs ordered are listed, but only abnormal results are displayed) Labs Reviewed - No data to display   EKG     RADIOLOGY  I personally viewed, evaluated, and interpreted these images as part of my medical decision making, as well as reviewing the written report by the radiologist.  ED Provider Interpretation: No acute traumatic finding on CT scan of the head, face or cervical spine.  Patient has fracture of the fourth metacarpal.  Ribs are reassuring at this time.  CT Head Wo Contrast  Result Date: 05/04/2022 CLINICAL DATA:  Head trauma EXAM: CT HEAD WITHOUT CONTRAST CT MAXILLOFACIAL WITHOUT CONTRAST CT CERVICAL SPINE WITHOUT CONTRAST TECHNIQUE: Multidetector CT imaging of the head, cervical spine, and maxillofacial structures  were performed using the standard protocol without intravenous contrast. Multiplanar CT image reconstructions of the cervical spine and maxillofacial structures were also generated. RADIATION DOSE REDUCTION: This exam was performed according to the departmental dose-optimization program which includes automated exposure control, adjustment of the mA and/or kV according to patient size and/or use of iterative reconstruction technique. COMPARISON:  Brain MRI 05/18/2021 FINDINGS: CT HEAD FINDINGS Brain: No acute territorial infarction, hemorrhage or intracranial mass. The ventricles are nonenlarged. Vascular: No hyperdense vessels.  Carotid vascular calcification Skull: Normal. Negative for fracture or focal lesion. Other: None. CT MAXILLOFACIAL FINDINGS Osseous: Mastoid air cells are  clear. Mandibular heads are normally position. No mandibular fracture. Pterygoid plates and zygomatic arches are intact. No acute nasal bone fracture Orbits: Negative. No traumatic or inflammatory finding. Sinuses: Clear. Soft tissues: Mild left cheek soft tissue swelling CT CERVICAL SPINE FINDINGS Alignment: Straightening of the cervical spine. No subluxation. Facet alignment is within normal limits Skull base and vertebrae: No acute fracture. No primary bone lesion or focal pathologic process. Soft tissues and spinal canal: No prevertebral fluid or swelling. No visible canal hematoma. Disc levels: Multilevel degenerative change. Advanced disc space narrowing C4-C5 and C5-C6 with moderate disc space narrowing at C6-C7. Facet degenerative changes at multiple levels with foraminal narrowing Upper chest: Negative. Other: None IMPRESSION: 1. Negative non contrasted CT appearance of the brain. 2. Straightening of the cervical spine with degenerative changes. No acute osseous abnormality. 3. No acute facial bone fracture. Electronically Signed   By: Donavan Foil M.D.   On: 05/04/2022 18:09   CT MAXILLOFACIAL WO CONTRAST  Result Date: 05/04/2022 CLINICAL DATA:  Head trauma EXAM: CT HEAD WITHOUT CONTRAST CT MAXILLOFACIAL WITHOUT CONTRAST CT CERVICAL SPINE WITHOUT CONTRAST TECHNIQUE: Multidetector CT imaging of the head, cervical spine, and maxillofacial structures were performed using the standard protocol without intravenous contrast. Multiplanar CT image reconstructions of the cervical spine and maxillofacial structures were also generated. RADIATION DOSE REDUCTION: This exam was performed according to the departmental dose-optimization program which includes automated exposure control, adjustment of the mA and/or kV according to patient size and/or use of iterative reconstruction technique. COMPARISON:  Brain MRI 05/18/2021 FINDINGS: CT HEAD FINDINGS Brain: No acute territorial infarction, hemorrhage or  intracranial mass. The ventricles are nonenlarged. Vascular: No hyperdense vessels.  Carotid vascular calcification Skull: Normal. Negative for fracture or focal lesion. Other: None. CT MAXILLOFACIAL FINDINGS Osseous: Mastoid air cells are clear. Mandibular heads are normally position. No mandibular fracture. Pterygoid plates and zygomatic arches are intact. No acute nasal bone fracture Orbits: Negative. No traumatic or inflammatory finding. Sinuses: Clear. Soft tissues: Mild left cheek soft tissue swelling CT CERVICAL SPINE FINDINGS Alignment: Straightening of the cervical spine. No subluxation. Facet alignment is within normal limits Skull base and vertebrae: No acute fracture. No primary bone lesion or focal pathologic process. Soft tissues and spinal canal: No prevertebral fluid or swelling. No visible canal hematoma. Disc levels: Multilevel degenerative change. Advanced disc space narrowing C4-C5 and C5-C6 with moderate disc space narrowing at C6-C7. Facet degenerative changes at multiple levels with foraminal narrowing Upper chest: Negative. Other: None IMPRESSION: 1. Negative non contrasted CT appearance of the brain. 2. Straightening of the cervical spine with degenerative changes. No acute osseous abnormality. 3. No acute facial bone fracture. Electronically Signed   By: Donavan Foil M.D.   On: 05/04/2022 18:09   CT Cervical Spine Wo Contrast  Result Date: 05/04/2022 CLINICAL DATA:  Head trauma EXAM: CT HEAD WITHOUT CONTRAST CT MAXILLOFACIAL WITHOUT CONTRAST  CT CERVICAL SPINE WITHOUT CONTRAST TECHNIQUE: Multidetector CT imaging of the head, cervical spine, and maxillofacial structures were performed using the standard protocol without intravenous contrast. Multiplanar CT image reconstructions of the cervical spine and maxillofacial structures were also generated. RADIATION DOSE REDUCTION: This exam was performed according to the departmental dose-optimization program which includes automated exposure  control, adjustment of the mA and/or kV according to patient size and/or use of iterative reconstruction technique. COMPARISON:  Brain MRI 05/18/2021 FINDINGS: CT HEAD FINDINGS Brain: No acute territorial infarction, hemorrhage or intracranial mass. The ventricles are nonenlarged. Vascular: No hyperdense vessels.  Carotid vascular calcification Skull: Normal. Negative for fracture or focal lesion. Other: None. CT MAXILLOFACIAL FINDINGS Osseous: Mastoid air cells are clear. Mandibular heads are normally position. No mandibular fracture. Pterygoid plates and zygomatic arches are intact. No acute nasal bone fracture Orbits: Negative. No traumatic or inflammatory finding. Sinuses: Clear. Soft tissues: Mild left cheek soft tissue swelling CT CERVICAL SPINE FINDINGS Alignment: Straightening of the cervical spine. No subluxation. Facet alignment is within normal limits Skull base and vertebrae: No acute fracture. No primary bone lesion or focal pathologic process. Soft tissues and spinal canal: No prevertebral fluid or swelling. No visible canal hematoma. Disc levels: Multilevel degenerative change. Advanced disc space narrowing C4-C5 and C5-C6 with moderate disc space narrowing at C6-C7. Facet degenerative changes at multiple levels with foraminal narrowing Upper chest: Negative. Other: None IMPRESSION: 1. Negative non contrasted CT appearance of the brain. 2. Straightening of the cervical spine with degenerative changes. No acute osseous abnormality. 3. No acute facial bone fracture. Electronically Signed   By: Donavan Foil M.D.   On: 05/04/2022 18:09   DG Hand Complete Left  Result Date: 05/04/2022 CLINICAL DATA:  Fall EXAM: LEFT HAND - COMPLETE 3+ VIEW COMPARISON:  None Available. FINDINGS: There is diffuse osteopenia. There is an acute oblique fracture through the mid fourth metacarpal. There is 2 mm of medial distraction of the distal fracture fragment. There is overlying soft tissue swelling. There is no  dislocation. There are mild degenerative changes of the third distal interphalangeal joint, lateral wrist and radiocarpal joint. IMPRESSION: Acute oblique fracture through the mid fourth metacarpal with 2 mm of medial distraction of the distal fracture fragment. Electronically Signed   By: Ronney Asters M.D.   On: 05/04/2022 18:08   DG Ribs Unilateral W/Chest Left  Result Date: 05/04/2022 CLINICAL DATA:  Fall, left rib pain. EXAM: LEFT RIBS AND CHEST - 3+ VIEW COMPARISON:  None Available. FINDINGS: The cardiomediastinal silhouette is normal. There is no focal consolidation or pulmonary edema. There is no pleural effusion or pneumothorax No displaced left rib fracture or other acute osseous abnormality is identified. There are remote fractures of multiple right-sided ribs. Left breast surgical clips are noted. IMPRESSION: No displaced rib fracture or other acute osseous abnormality identified. Electronically Signed   By: Valetta Mole M.D.   On: 05/04/2022 18:07    PROCEDURES:  Critical Care performed: No  Procedures   MEDICATIONS ORDERED IN ED: Medications - No data to display   IMPRESSION / MDM / [place] / ED COURSE  I reviewed the triage vital signs and the nursing notes.                                 Differential diagnosis includes, but is not limited to, fall, facial contusion, skull fracture, facial fracture, intracranial hemorrhage, hand fracture, finger fracture, rib  fracture, pneumothorax  Patient's presentation is most consistent with acute presentation with potential threat to life or bodily function.   Patient's diagnosis is consistent with fall, facial contusion, metacarpal fracture.  Patient presents the emergency department after mechanical fall.  Was seen in urgent care and referred to the emergency department for imaging of her head, face, neck, hand and ribs.  Patient's imaging of her head, face and neck are reassuring with no fracture or other signs of  trauma.  Patient has a fourth metacarpal fracture.  X-ray of the ribs are reassuring with no rib fracture or pneumothorax.  At this time patient will be splinted.  Prescription for pain medication will be prescribed.  Follow-up with orthopedics.  Return precautions discussed with patient and husband..  Patient is given ED precautions to return to the ED for any worsening or new symptoms.     FINAL CLINICAL IMPRESSION(S) / ED DIAGNOSES   Final diagnoses:  Fall, initial encounter  Contusion of face, initial encounter  Closed displaced fracture of shaft of fourth metacarpal bone of left hand, initial encounter     Rx / DC Orders   ED Discharge Orders          Ordered    oxyCODONE-acetaminophen (PERCOCET/ROXICET) 5-325 MG tablet  Every 6 hours PRN        05/04/22 1927             Note:  This document was prepared using Dragon voice recognition software and may include unintentional dictation errors.   Brynda Peon 05/04/22 1927    Lavonia Drafts, MD 05/04/22 2006

## 2022-05-04 NOTE — ED Notes (Signed)
Patient is being discharged from the Urgent Care and sent to the Emergency Department via POV . Per Annie Main Immordino, patient is in need of higher level of care due to Providers assessment. Patient is aware and verbalizes understanding of plan of care.  Vitals:   05/04/22 1535  BP: 139/86  Pulse: 97  Resp: 17  Temp: 97.9 F (36.6 C)  SpO2: 97%

## 2022-05-04 NOTE — ED Triage Notes (Signed)
Pt reports that she was walking on sidewalk and tripped, pain to left side of face, hematoma noted, left hand and left rib pain. Pt ambulatory to triage. A/O x 4. Denies LOC. NO thinners.

## 2022-05-04 NOTE — ED Triage Notes (Signed)
Pt. Presents to UC w/ c/o soreness to her left jaw and hand d/t falling after tripping in her driveway today.

## 2022-05-04 NOTE — ED Provider Notes (Signed)
Roderic Palau    CSN: [id number] Arrival date & time: 05/04/22  1519      History   Chief Complaint Chief Complaint  Patient presents with   Fall    HPI Tina Armstrong is a 77 y.o. female.    Fall    Presents to urgent care with soreness to her left jaw and hand after falling in her driveway today. She says she landed on the L side of her face and has pain at her cheek below her eye. She also has pain in the 3rd and 4th fingers of her L hand.  Past Medical History:  Diagnosis Date   Anxiety    Family history of breast cancer    History of cancer    breast   Hyperlipemia    Mitral valve prolapse    Osteoporosis    Personal history of radiation therapy    breast   Seizures ([place])    Thyroid disease     Patient Active Problem List   Diagnosis Date Noted   Ductal carcinoma in situ (DCIS) of left breast 04/15/2022   Bradycardia 10/07/2020   Intermittent palpitations 10/07/2020   SOBOE (shortness of breath on exertion) 10/07/2020   Family history of breast cancer    Acquired hypothyroidism 09/06/2019   Hypercholesterolemia 09/06/2019   RLS (restless legs syndrome) 09/06/2019   Seizure disorder ([place]) 09/06/2019   Single major depressive episode, in full remission ([place]) 09/06/2019    Past Surgical History:  Procedure Laterality Date   ABDOMINAL HYSTERECTOMY     total   APPENDECTOMY     BREAST EXCISIONAL BIOPSY Left 1995   BREAST LUMPECTOMY Left 2016   MASTECTOMY PARTIAL / LUMPECTOMY Left 04/10/2016   MASTECTOMY, PARTIAL Left 04/12/2015    OB History   No obstetric history on file.      Home Medications    Prior to Admission medications   Medication Sig Start Date End Date Taking? Authorizing Provider  alendronate (FOSAMAX) 70 MG tablet Take 70 mg by mouth once a week. 11/26/21 11/26/22  [provider]  anastrozole (ARIMIDEX) 1 MG tablet TAKE 1 TABLET EVERY DAY 11/22/21   Cammie Sickle, MD  aspirin EC 81 MG tablet Take 81 mg  by mouth daily. Swallow whole.    [provider]  atorvastatin (LIPITOR) 20 MG tablet Take 1 tablet by mouth daily. 09/23/19   [provider]  Calcium Carb-Cholecalciferol (CALCIUM 1000 + D PO) Take 1 tablet by mouth daily.    [provider]  Cholecalciferol 25 MCG (1000 UT) capsule Take 1,000 Units by mouth daily. Patient not taking: Reported on 10/08/2021    [provider]  cyanocobalamin 1000 MCG tablet Take 1,000 mcg by mouth daily.    [provider]  ferrous sulfate 325 (65 FE) MG tablet Take 325 mg by mouth daily.    [provider]  gabapentin (NEURONTIN) 300 MG capsule Take 300 mg by mouth daily. 09/16/19   [provider]  levothyroxine (SYNTHROID) 100 MCG tablet Take 100 mcg by mouth daily.    [provider]  pantoprazole (PROTONIX) 20 MG tablet Take 20 mg by mouth daily. Patient not taking: Reported on 10/08/2021 06/11/21   [provider]  Potassium Gluconate 2.5 MEQ TABS Take 2.5 mEq by mouth daily.    [provider]  zinc gluconate 50 MG tablet Take 50 mg by mouth daily.    [provider]    Family History Family History  Problem Relation Age of Onset   Breast cancer Mother 80   Heart attack Father    Heart attack Brother    Osteoporosis Paternal Grandmother    Heart attack Brother    Heart attack Brother     Social History Social History   Tobacco Use   Smoking status: Former    Types: Cigarettes    Start date: 2002    Quit date: 2007    Years since quitting: 17.0   Smokeless tobacco: Never  Substance Use Topics   Alcohol use: Yes    Alcohol/week: 1.0 standard drink of alcohol    Types: 1 Glasses of wine per week   Drug use: Never     Allergies   Atenolol and Diltiazem   Review of Systems Review of Systems   Physical Exam Triage Vital Signs ED Triage Vitals  Enc Vitals Group     BP 05/04/22 1535 139/86     Pulse Rate 05/04/22 1535 97     Resp  05/04/22 1535 17     Temp 05/04/22 1535 97.9 F (36.6 C)     Temp src --      SpO2 05/04/22 1535 97 %     Weight --      Height --      Head Circumference --      Peak Flow --      Pain Score 05/04/22 1536 5     Pain Loc --      Pain Edu? --      Excl. in [place]? --    No data found.  Updated Vital Signs BP 139/86   Pulse 97   Temp 97.9 F (36.6 C)   Resp 17   SpO2 97%   Visual Acuity Right Eye Distance:   Left Eye Distance:   Bilateral Distance:    Right Eye Near:   Left Eye Near:    Bilateral Near:     Physical Exam Vitals reviewed.  Constitutional:      Appearance: Normal appearance.  HENT:     Head:   Musculoskeletal:       Hands:  Skin:    General: Skin is warm and dry.  Neurological:     General: No focal deficit present.     Mental Status: She is alert and oriented to person, place, and time.  Psychiatric:        Mood and Affect: Mood normal.        Behavior: Behavior normal.      UC Treatments / Results  Labs (all labs ordered are listed, but only abnormal results are displayed) Labs Reviewed - No data to display  EKG   Radiology No results found.  Procedures Procedures (including critical care time)  Medications Ordered in UC Medications - No data to display  Initial Impression / Assessment and Plan / UC Course  I have reviewed the triage vital signs and the nursing notes.  Pertinent labs & imaging results that were available during my care of the patient were reviewed by me and considered in my medical decision making (see chart for details).   Patient is afebrile here without recent antipyretics. Satting well on room air. Overall is well appearing, well hydrated, without respiratory distress.  Trip and fall injury to her left face.  Concern for trauma to the zygomatic versus maxilla.  Recommending patient go to ED for evaluation in a setting with facial imaging capability.  Some injury to the third and fourth fingers  of her left  hand.  There is no deformity or swelling, however there is tenderness with palpation and flexion.   Final Clinical Impressions(s) / UC Diagnoses   Final diagnoses:  None   Discharge Instructions   None    ED Prescriptions   None    PDMP not reviewed this encounter.   Rose Phi, FNP 05/04/22 1600

## 2022-05-04 NOTE — ED Notes (Signed)
Colletta [place], EDT at bedside to apply splint.

## 2022-05-04 NOTE — Discharge Instructions (Signed)
Recommend you proceed to ED for evaluation of your head and hand injury.

## 2022-05-10 DIAGNOSIS — H903 Sensorineural hearing loss, bilateral: Secondary | ICD-10-CM | POA: Diagnosis not present

## 2022-05-11 DIAGNOSIS — M79642 Pain in left hand: Secondary | ICD-10-CM | POA: Diagnosis not present

## 2022-05-11 DIAGNOSIS — S62325A Displaced fracture of shaft of fourth metacarpal bone, left hand, initial encounter for closed fracture: Secondary | ICD-10-CM | POA: Diagnosis not present

## 2022-05-21 LAB — COLOGUARD

## 2022-05-21 LAB — EXTERNAL GENERIC LAB PROCEDURE

## 2022-06-02 DIAGNOSIS — M5033 Other cervical disc degeneration, cervicothoracic region: Secondary | ICD-10-CM | POA: Diagnosis not present

## 2022-06-02 DIAGNOSIS — M9901 Segmental and somatic dysfunction of cervical region: Secondary | ICD-10-CM | POA: Diagnosis not present

## 2022-06-02 DIAGNOSIS — M9902 Segmental and somatic dysfunction of thoracic region: Secondary | ICD-10-CM | POA: Diagnosis not present

## 2022-06-02 DIAGNOSIS — M5412 Radiculopathy, cervical region: Secondary | ICD-10-CM | POA: Diagnosis not present

## 2022-06-08 DIAGNOSIS — M79642 Pain in left hand: Secondary | ICD-10-CM | POA: Diagnosis not present

## 2022-06-08 DIAGNOSIS — S62325A Displaced fracture of shaft of fourth metacarpal bone, left hand, initial encounter for closed fracture: Secondary | ICD-10-CM | POA: Diagnosis not present

## 2022-06-09 DIAGNOSIS — Z1211 Encounter for screening for malignant neoplasm of colon: Secondary | ICD-10-CM | POA: Diagnosis not present

## 2022-06-20 LAB — EXTERNAL GENERIC LAB PROCEDURE: COLOGUARD: NEGATIVE

## 2022-06-20 LAB — COLOGUARD: COLOGUARD: NEGATIVE

## 2022-07-06 DIAGNOSIS — S62325D Displaced fracture of shaft of fourth metacarpal bone, left hand, subsequent encounter for fracture with routine healing: Secondary | ICD-10-CM | POA: Diagnosis not present

## 2022-07-14 DIAGNOSIS — M9902 Segmental and somatic dysfunction of thoracic region: Secondary | ICD-10-CM | POA: Diagnosis not present

## 2022-07-14 DIAGNOSIS — M5033 Other cervical disc degeneration, cervicothoracic region: Secondary | ICD-10-CM | POA: Diagnosis not present

## 2022-07-14 DIAGNOSIS — M9901 Segmental and somatic dysfunction of cervical region: Secondary | ICD-10-CM | POA: Diagnosis not present

## 2022-07-14 DIAGNOSIS — M5412 Radiculopathy, cervical region: Secondary | ICD-10-CM | POA: Diagnosis not present

## 2022-08-25 DIAGNOSIS — M9902 Segmental and somatic dysfunction of thoracic region: Secondary | ICD-10-CM | POA: Diagnosis not present

## 2022-08-25 DIAGNOSIS — M5033 Other cervical disc degeneration, cervicothoracic region: Secondary | ICD-10-CM | POA: Diagnosis not present

## 2022-08-25 DIAGNOSIS — M5412 Radiculopathy, cervical region: Secondary | ICD-10-CM | POA: Diagnosis not present

## 2022-08-25 DIAGNOSIS — M9901 Segmental and somatic dysfunction of cervical region: Secondary | ICD-10-CM | POA: Diagnosis not present

## 2022-08-25 IMAGING — MG MM DIGITAL DIAGNOSTIC UNILAT*L* W/ TOMO W/ CAD
7 series · 8 of 15 positions shown · non-contrast
Comparison: Previous exam(s).

CLINICAL DATA: 74-year-old female with history of left breast
lumpectomy in 6410 presenting for follow-up of likely benign
calcifications at the lumpectomy site.

EXAM:
DIGITAL DIAGNOSTIC UNILATERAL LEFT MAMMOGRAM WITH TOMO AND CAD
TECHNIQUE: Bilateral digital diagnostic mammography and breast tomosynthesis
was performed. Digital images of the breasts were evaluated with
computer-aided detection.

[L ML]
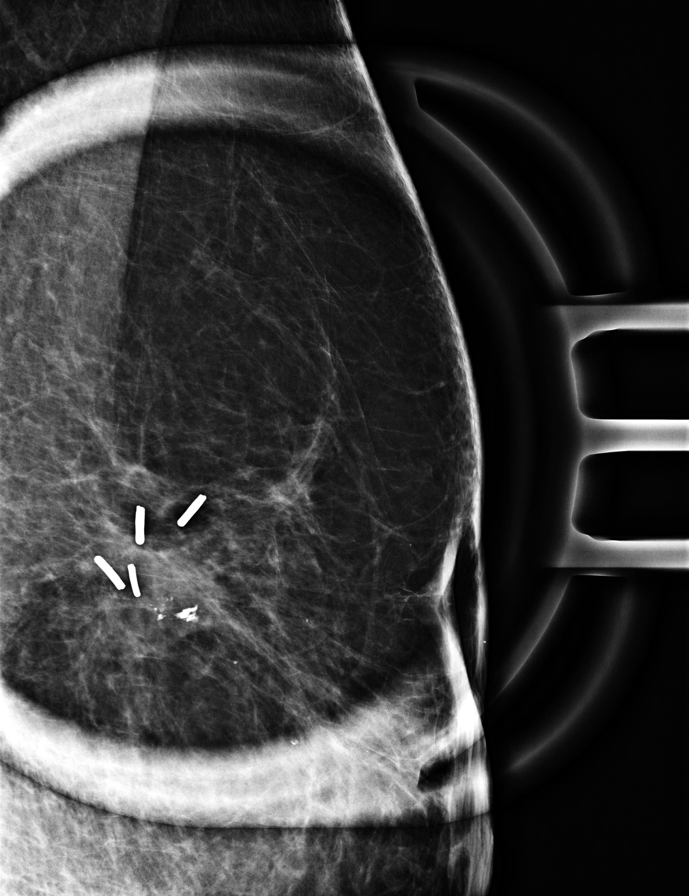

[L CC (1 of 2)]
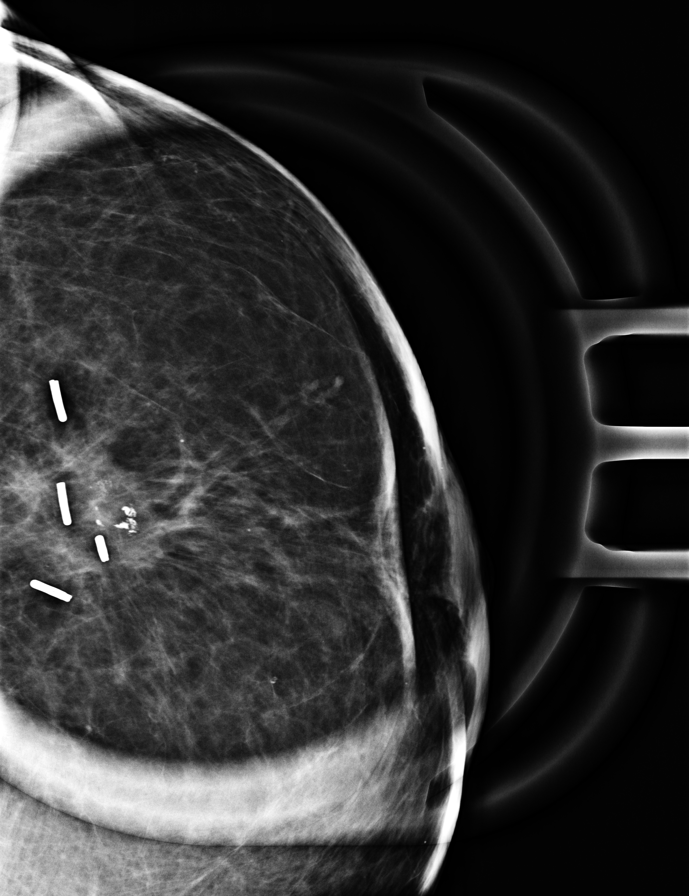

[L CC (2 of 2)]
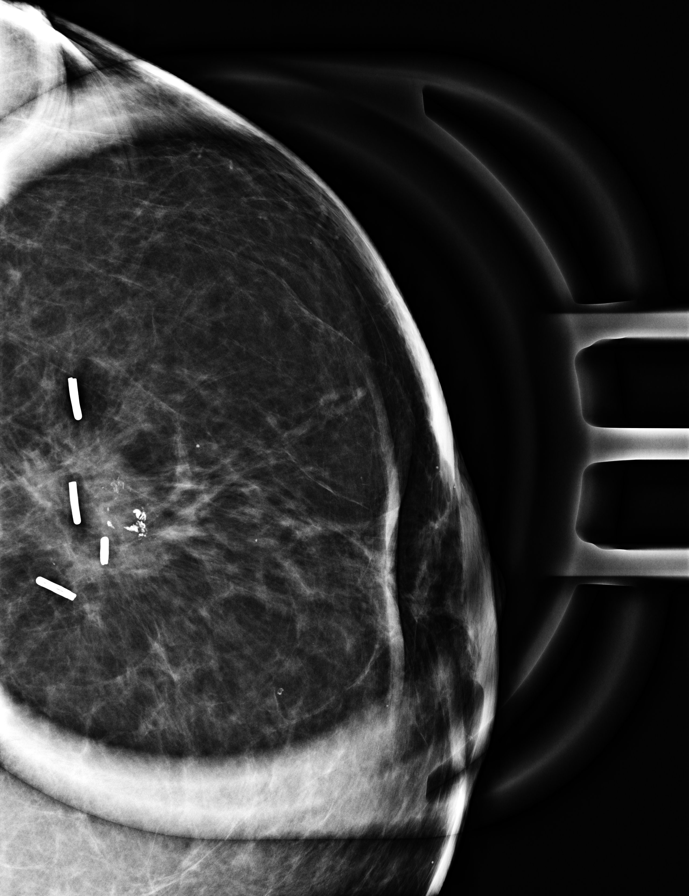

[L MLO synth-2D]
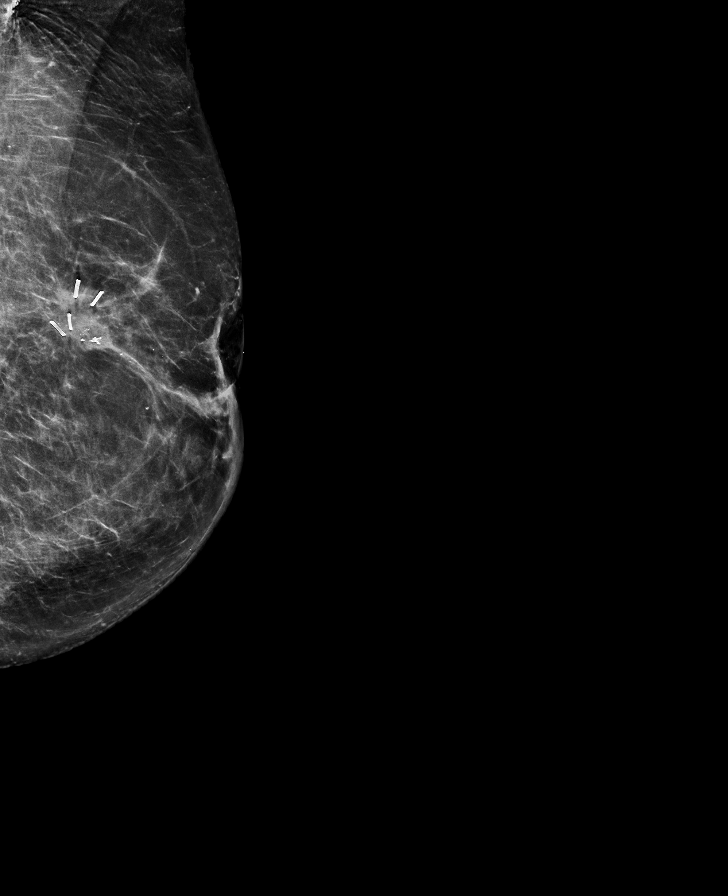

[L CC synth-2D]
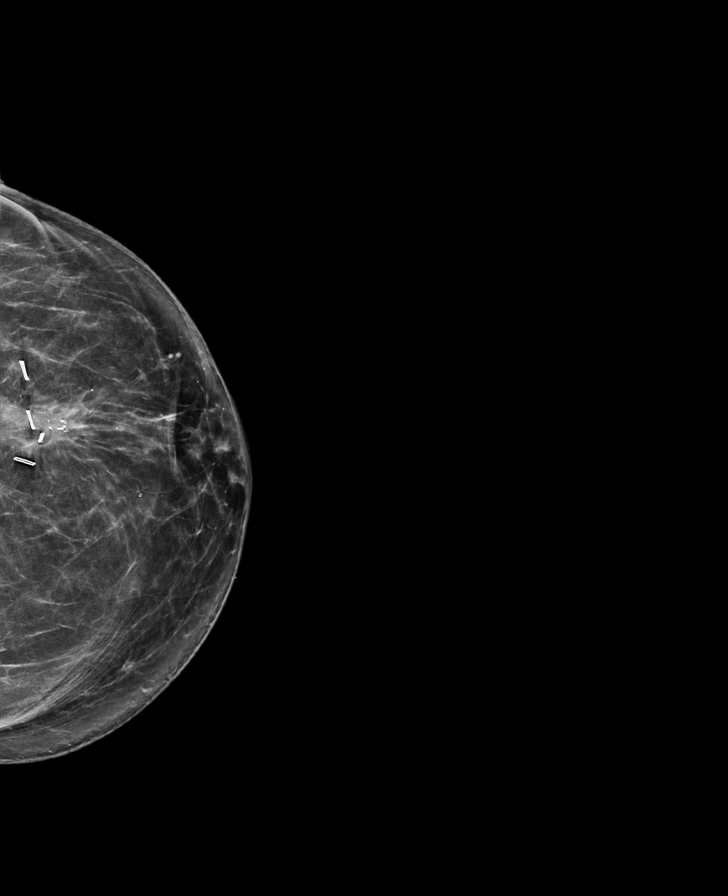

[L CC tomo · 2 of 71 frames shown]
[frame 23/71]
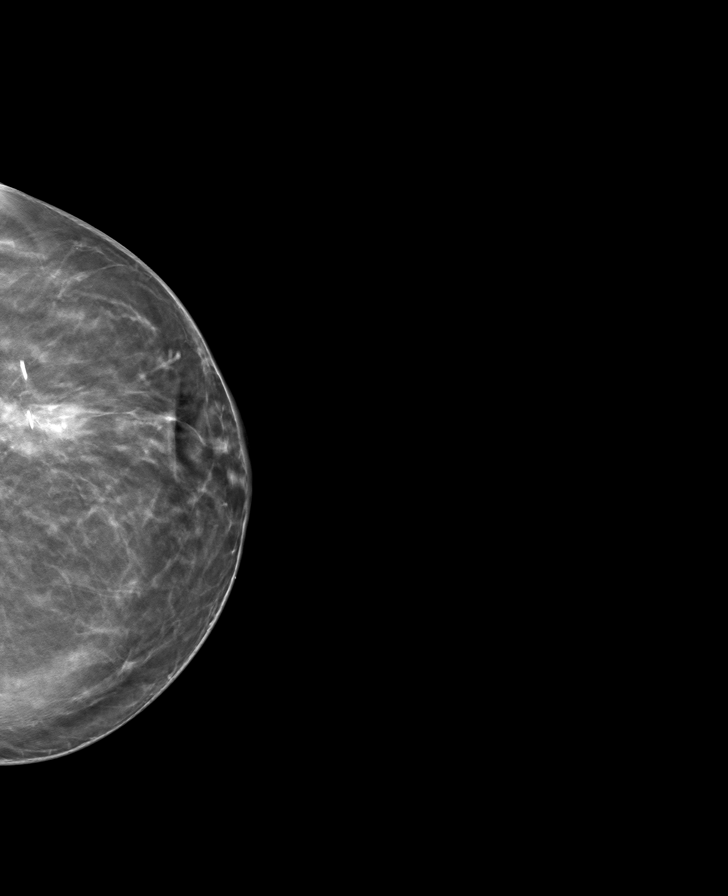
[frame 36/71]
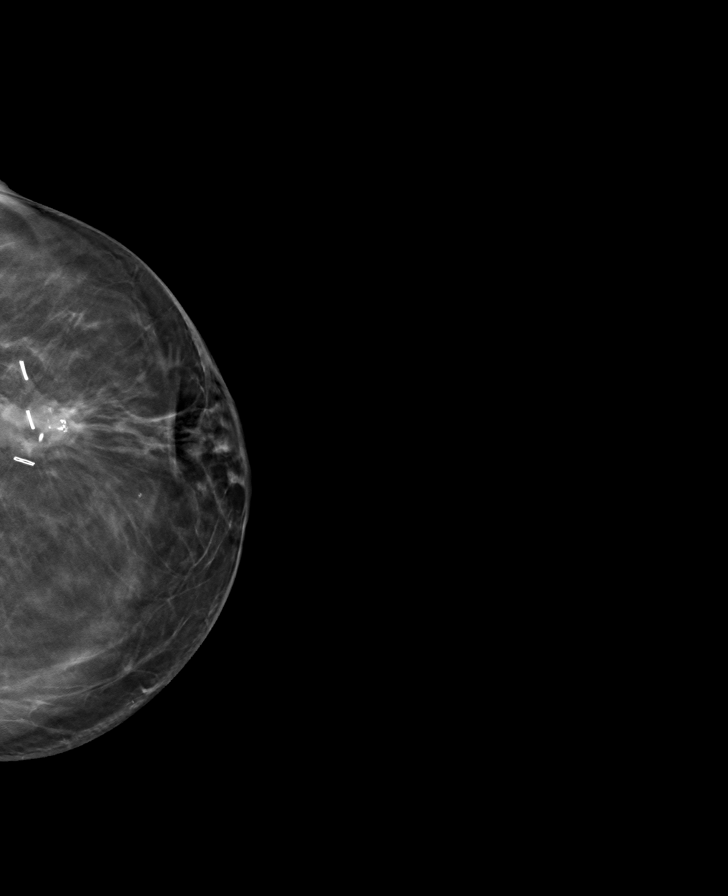

[L MLO tomo · tomo slice 35/69.0]
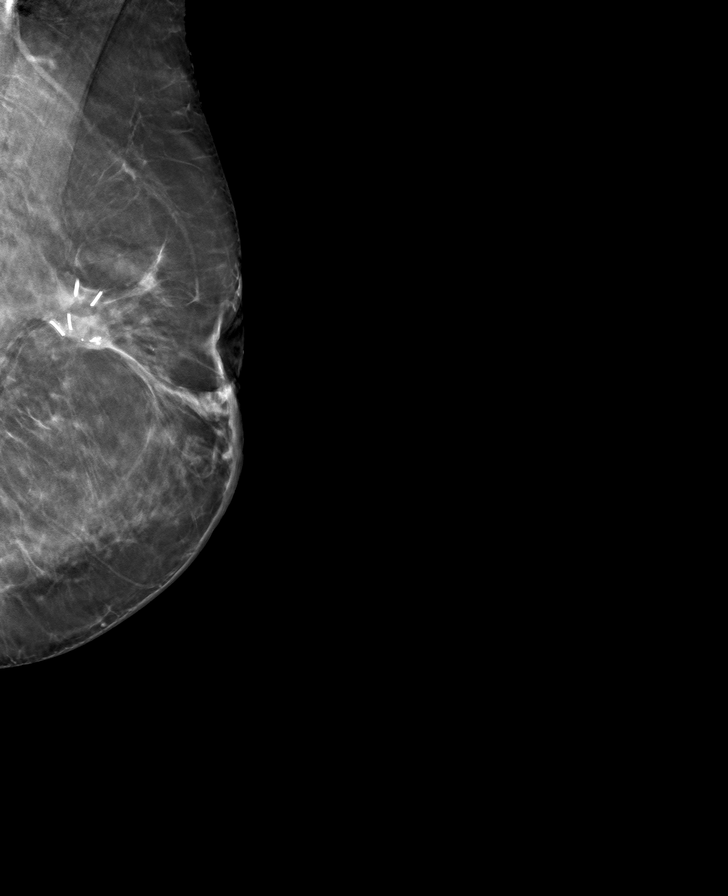

[8 of 15 positions shown; findings below may reference images not displayed]

ACR Breast Density Category b: There are scattered areas of
fibroglandular density.
FINDINGS: The small group of coarse heterogeneous calcifications at the
lumpectomy site in the upper-outer quadrant of the left breast are
stable. No new suspicious calcifications, masses or areas of
distortion are seen in the left breast.
IMPRESSION: Stable likely benign left breast calcifications.

RECOMMENDATION:
Bilateral diagnostic mammogram is recommended in Friday October, 2020.

I have discussed the findings and recommendations with the patient.
If applicable, a reminder letter will be sent to the patient
regarding the next appointment.

BI-RADS CATEGORY  3: Probably benign.

## 2022-09-06 DIAGNOSIS — R300 Dysuria: Secondary | ICD-10-CM | POA: Diagnosis not present

## 2022-09-09 DIAGNOSIS — R351 Nocturia: Secondary | ICD-10-CM | POA: Diagnosis not present

## 2022-09-19 ENCOUNTER — Other Ambulatory Visit: Payer: Self-pay | Admitting: Family Medicine

## 2022-09-19 DIAGNOSIS — Z1231 Encounter for screening mammogram for malignant neoplasm of breast: Secondary | ICD-10-CM

## 2022-10-06 DIAGNOSIS — M5412 Radiculopathy, cervical region: Secondary | ICD-10-CM | POA: Diagnosis not present

## 2022-10-06 DIAGNOSIS — M5033 Other cervical disc degeneration, cervicothoracic region: Secondary | ICD-10-CM | POA: Diagnosis not present

## 2022-10-06 DIAGNOSIS — M9901 Segmental and somatic dysfunction of cervical region: Secondary | ICD-10-CM | POA: Diagnosis not present

## 2022-10-06 DIAGNOSIS — M9902 Segmental and somatic dysfunction of thoracic region: Secondary | ICD-10-CM | POA: Diagnosis not present

## 2022-10-28 DIAGNOSIS — E785 Hyperlipidemia, unspecified: Secondary | ICD-10-CM | POA: Diagnosis not present

## 2022-10-28 DIAGNOSIS — G629 Polyneuropathy, unspecified: Secondary | ICD-10-CM | POA: Diagnosis not present

## 2022-10-28 DIAGNOSIS — E039 Hypothyroidism, unspecified: Secondary | ICD-10-CM | POA: Diagnosis not present

## 2022-10-28 DIAGNOSIS — Z853 Personal history of malignant neoplasm of breast: Secondary | ICD-10-CM | POA: Diagnosis not present

## 2022-10-28 DIAGNOSIS — G2581 Restless legs syndrome: Secondary | ICD-10-CM | POA: Diagnosis not present

## 2022-11-03 ENCOUNTER — Ambulatory Visit
Admission: RE | Admit: 2022-11-03 | Discharge: 2022-11-03 | Disposition: A | Discharge status: Home / Self Care | Payer: Medicare HMO | Source: Ambulatory Visit | Attending: Family Medicine | Admitting: Family Medicine

## 2022-11-03 DIAGNOSIS — Z1231 Encounter for screening mammogram for malignant neoplasm of breast: Secondary | ICD-10-CM | POA: Diagnosis not present

## 2022-11-04 DIAGNOSIS — R001 Bradycardia, unspecified: Secondary | ICD-10-CM | POA: Diagnosis not present

## 2022-11-04 DIAGNOSIS — E039 Hypothyroidism, unspecified: Secondary | ICD-10-CM | POA: Diagnosis not present

## 2022-11-04 DIAGNOSIS — M810 Age-related osteoporosis without current pathological fracture: Secondary | ICD-10-CM | POA: Diagnosis not present

## 2022-11-04 DIAGNOSIS — E7800 Pure hypercholesterolemia, unspecified: Secondary | ICD-10-CM | POA: Diagnosis not present

## 2022-11-24 DIAGNOSIS — M5412 Radiculopathy, cervical region: Secondary | ICD-10-CM | POA: Diagnosis not present

## 2022-11-24 DIAGNOSIS — M9901 Segmental and somatic dysfunction of cervical region: Secondary | ICD-10-CM | POA: Diagnosis not present

## 2022-11-24 DIAGNOSIS — M9902 Segmental and somatic dysfunction of thoracic region: Secondary | ICD-10-CM | POA: Diagnosis not present

## 2022-11-24 DIAGNOSIS — M5033 Other cervical disc degeneration, cervicothoracic region: Secondary | ICD-10-CM | POA: Diagnosis not present

## 2023-01-05 DIAGNOSIS — M5412 Radiculopathy, cervical region: Secondary | ICD-10-CM | POA: Diagnosis not present

## 2023-01-05 DIAGNOSIS — M9901 Segmental and somatic dysfunction of cervical region: Secondary | ICD-10-CM | POA: Diagnosis not present

## 2023-01-05 DIAGNOSIS — M9902 Segmental and somatic dysfunction of thoracic region: Secondary | ICD-10-CM | POA: Diagnosis not present

## 2023-01-05 DIAGNOSIS — M5033 Other cervical disc degeneration, cervicothoracic region: Secondary | ICD-10-CM | POA: Diagnosis not present

## 2023-02-16 DIAGNOSIS — M5412 Radiculopathy, cervical region: Secondary | ICD-10-CM | POA: Diagnosis not present

## 2023-02-16 DIAGNOSIS — M5033 Other cervical disc degeneration, cervicothoracic region: Secondary | ICD-10-CM | POA: Diagnosis not present

## 2023-02-16 DIAGNOSIS — M9901 Segmental and somatic dysfunction of cervical region: Secondary | ICD-10-CM | POA: Diagnosis not present

## 2023-02-16 DIAGNOSIS — M9902 Segmental and somatic dysfunction of thoracic region: Secondary | ICD-10-CM | POA: Diagnosis not present

## 2023-04-06 DIAGNOSIS — M9901 Segmental and somatic dysfunction of cervical region: Secondary | ICD-10-CM | POA: Diagnosis not present

## 2023-04-06 DIAGNOSIS — M5412 Radiculopathy, cervical region: Secondary | ICD-10-CM | POA: Diagnosis not present

## 2023-04-06 DIAGNOSIS — M9902 Segmental and somatic dysfunction of thoracic region: Secondary | ICD-10-CM | POA: Diagnosis not present

## 2023-04-06 DIAGNOSIS — M5033 Other cervical disc degeneration, cervicothoracic region: Secondary | ICD-10-CM | POA: Diagnosis not present

## 2023-04-11 DIAGNOSIS — H2513 Age-related nuclear cataract, bilateral: Secondary | ICD-10-CM | POA: Diagnosis not present

## 2023-04-11 DIAGNOSIS — Z0100 Encounter for examination of eyes and vision without abnormal findings: Secondary | ICD-10-CM | POA: Diagnosis not present

## 2023-04-11 DIAGNOSIS — H348192 Central retinal vein occlusion, unspecified eye, stable: Secondary | ICD-10-CM | POA: Diagnosis not present

## 2023-04-17 ENCOUNTER — Inpatient Hospital Stay: Payer: Medicare HMO | Attending: Internal Medicine | Admitting: Internal Medicine

## 2023-04-17 ENCOUNTER — Encounter: Payer: Self-pay | Admitting: Internal Medicine

## 2023-04-17 VITALS — BP 115/55 | HR 52 | Temp 98.3°F | Resp 16 | Wt 127.0 lb

## 2023-04-17 DIAGNOSIS — Z79811 Long term (current) use of aromatase inhibitors: Secondary | ICD-10-CM | POA: Insufficient documentation

## 2023-04-17 DIAGNOSIS — Z79899 Other long term (current) drug therapy: Secondary | ICD-10-CM | POA: Diagnosis not present

## 2023-04-17 DIAGNOSIS — Z87891 Personal history of nicotine dependence: Secondary | ICD-10-CM | POA: Insufficient documentation

## 2023-04-17 DIAGNOSIS — D0512 Intraductal carcinoma in situ of left breast: Secondary | ICD-10-CM | POA: Diagnosis not present

## 2023-04-17 DIAGNOSIS — Z923 Personal history of irradiation: Secondary | ICD-10-CM | POA: Diagnosis not present

## 2023-04-17 DIAGNOSIS — Z7982 Long term (current) use of aspirin: Secondary | ICD-10-CM | POA: Insufficient documentation

## 2023-04-17 NOTE — Assessment & Plan Note (Addendum)
#   Left breast DCIS- ER positive; early stage [2016; patient initially reported as invasive cancer no records available].  However after review of records patient noted to have left breast DCIS.  S/p radiation. Arimidex  [started AUG 2016; [place] ].  Discontinue anastrozole  in JAN 2024.  July 2024-bilateral mammogram negative for any recurrence.  # Clinical exam no evidence of recurrence. Patient prefers breast exam in cancer center, so will continue to follow up on yearly basis. Will order mammo in July 2025.    # Osteoporosis [as per patient]/ BMD-Fosamax [per PCP].  Continue calcium plus vitamin D.  # Genetics: s/p genetics evaluation; pt is interested;-referral made however patient declined sec to cost issues.   # DISPOSITION:  # mammo in July 2025.   # follow up in 12  months- MD; no labs- -Dr.B

## 2023-04-17 NOTE — Progress Notes (Signed)
 Patient tripped on stairs 6 months ago and ever since has developed right hip pain that radiates down her leg. Mentioned to PCP who states it's arthritis. Soreness of left breast, chronic. Otherwise feels well. Normal appetite and energy.

## 2023-04-17 NOTE — Progress Notes (Signed)
 [facility] CONSULT NOTE  Patient Care Team: Glover Lenis, MD as PCP - General (Family Medicine) Rennie Tina SAUNDERS, MD as Consulting Physician (Oncology)  CHIEF COMPLAINTS/PURPOSE OF CONSULTATION: Breast cancer  #  Oncology History Overview Note  # 2016- LEFT BREAST DCIS ER POSITIVE; Her-2 NEG [s/p lumpectomy]; s/p RT;  [[place] ]; AUG 2016- anastrazole; discontinued January 2024 [given unavailability of records from [place] ]  #    Malignant neoplasm of left breast in female, estrogen receptor positive (HCC) (Resolved)  09/06/2019 Initial Diagnosis   Malignant neoplasm of left breast in female, estrogen receptor positive (HCC)   Ductal carcinoma in situ (DCIS) of left breast  04/15/2022 Initial Diagnosis   Ductal carcinoma in situ (DCIS) of left breast   04/15/2022 Cancer Staging   Staging form: Breast, AJCC 8th Edition - Clinical: Stage 0 (cTis (DCIS), cN0, cM0) - Signed by Makari Portman R, MD on 04/15/2022     HISTORY OF PRESENTING ILLNESS: Ambulating independently.  Alone.  Tina Armstrong 78 y.o.  female left breast DCIS [2016]  off anastrozole  x5 years-currently on surveillance is here for follow-up.  Patient tripped on stairs 6 months ago and ever since has developed right hip pain that radiates down her leg. Mentioned to PCP who states it's arthritis. Soreness of left breast, chronic. Otherwise feels well. Normal appetite and energy   Denies any complaints. Otherwise no lumps or bumps anywhere else.  No nausea no vomiting no headaches.   Review of Systems  Constitutional:  Negative for chills, diaphoresis, fever, malaise/fatigue and weight loss.  HENT:  Negative for nosebleeds and sore throat.   Eyes:  Negative for double vision.  Respiratory:  Negative for cough, hemoptysis, sputum production, shortness of breath and wheezing.   Cardiovascular:  Negative for chest pain, palpitations, orthopnea and leg swelling.  Gastrointestinal:  Negative for  abdominal pain, blood in stool, constipation, diarrhea, heartburn, melena, nausea and vomiting.  Genitourinary:  Negative for dysuria, frequency and urgency.  Musculoskeletal:  Negative for back pain and joint pain.  Skin: Negative.  Negative for itching and rash.  Neurological:  Negative for dizziness, tingling, focal weakness, weakness and headaches.  Endo/Heme/Allergies:  Does not bruise/bleed easily.  Psychiatric/Behavioral:  Negative for depression. The patient is not nervous/anxious and does not have insomnia.      MEDICAL HISTORY:  Past Medical History:  Diagnosis Date   Anxiety    Family history of breast cancer    History of cancer    breast   Hyperlipemia    Mitral valve prolapse    Osteoporosis    Personal history of radiation therapy    breast   Seizures (HCC)    Thyroid  disease     SURGICAL HISTORY: Past Surgical History:  Procedure Laterality Date   ABDOMINAL HYSTERECTOMY     total   APPENDECTOMY     BREAST EXCISIONAL BIOPSY Left 1995   BREAST LUMPECTOMY Left 2016   MASTECTOMY PARTIAL / LUMPECTOMY Left 04/10/2016   MASTECTOMY, PARTIAL Left 04/12/2015    SOCIAL HISTORY: Social History   Socioeconomic History   Marital status: Widowed    Spouse name: Not on file   Number of children: Not on file   Years of education: Not on file   Highest education level: Not on file  Occupational History   Not on file  Tobacco Use   Smoking status: Former    Current packs/day: 0.00    Types: Cigarettes    Start date: 2002    Quit  date: 2007    Years since quitting: 18.0   Smokeless tobacco: Never  Substance and Sexual Activity   Alcohol use: Yes    Alcohol/week: 1.0 standard drink of alcohol    Types: 1 Glasses of wine per week   Drug use: Never   Sexual activity: Not on file  Other Topics Concern   Not on file  Social History Narrative   Moved from [place] at age of 12 years; moved from [place]  to [place] for family reasons- lives in [place] daughter.   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 Was in social research officer, government.    Social Drivers of Corporate Investment Banker Strain: Low Risk  (04/29/2022)   Received from [facility], [facility]   Overall Financial Resource Strain (CARDIA)    Difficulty of Paying Living Expenses: Not hard at all  Food Insecurity: No Food Insecurity (04/29/2022)   Received from [facility], [facility] [place] [facility]   Hunger Vital Sign    Worried About Running Out of Food in the Last Year: Never true    Ran Out of Food in the Last Year: Never true  Transportation Needs: No Transportation Needs (04/29/2022)   Received from [facility], [facility]   [facility] - Transportation    In the past 12 months, has lack of transportation kept you from medical appointments or from getting medications?: No    Lack of Transportation (Non-Medical): No  Physical Activity: Not on file  Stress: Not on file  Social Connections: Not on file  Intimate Partner Violence: Not on file    FAMILY HISTORY: Family History  Problem Relation Age of Onset   Breast cancer Mother 107   Heart attack Father    Heart attack Brother    Osteoporosis Paternal Grandmother    Heart attack Brother    Heart attack Brother     ALLERGIES:  is allergic to atenolol and diltiazem.  MEDICATIONS:  Current Outpatient Medications  Medication Sig Dispense Refill   anastrozole  (ARIMIDEX ) 1 MG tablet TAKE 1 TABLET EVERY DAY 90 tablet 0   aspirin EC 81 MG tablet Take 81 mg by mouth daily. Swallow whole.     atorvastatin (LIPITOR) 20 MG tablet Take 1 tablet by mouth daily.     Calcium Carb-Cholecalciferol (CALCIUM 1000 + D PO) Take 1 tablet by mouth daily.     Cholecalciferol 25 MCG (1000 UT) capsule Take 1,000 Units by mouth daily.     cyanocobalamin  1000 MCG tablet Take 1,000 mcg by mouth daily.     ferrous sulfate 325 (65 FE) MG tablet Take 325 mg by mouth daily.      gabapentin (NEURONTIN) 300 MG capsule Take 300 mg by mouth daily.     levothyroxine (SYNTHROID) 100 MCG tablet Take 100 mcg by mouth daily.     zinc gluconate 50 MG tablet Take 50 mg by mouth daily.     alendronate (FOSAMAX) 70 MG tablet Take 70 mg by mouth once a week.     No current facility-administered medications for this visit.      Right and left BREAST exam [in the presence of nurse]- no unusual skin changes or dominant masses felt.  Left breast telangiectasias /surgical scars noted.   PHYSICAL EXAMINATION: ECOG PERFORMANCE STATUS: 0 - Asymptomatic  Vitals:   04/17/23 1302  BP: (!) 115/55  Pulse: (!) 52  Resp: 16  Temp: 98.3 F (36.8 C)  SpO2: 100%  Filed Weights   04/17/23 1302  Weight: 127 lb (57.6 kg)     Physical Exam HENT:     Head: Normocephalic and atraumatic.     Mouth/Throat:     Pharynx: No oropharyngeal exudate.  Eyes:     Pupils: Pupils are equal, round, and reactive to light.  Cardiovascular:     Rate and Rhythm: Normal rate and regular rhythm.  Pulmonary:     Effort: Pulmonary effort is normal. No respiratory distress.     Breath sounds: Normal breath sounds. No wheezing.  Abdominal:     General: Bowel sounds are normal. There is no distension.     Palpations: Abdomen is soft. There is no mass.     Tenderness: There is no abdominal tenderness. There is no guarding or rebound.  Musculoskeletal:        General: No tenderness. Normal range of motion.     Cervical back: Normal range of motion and neck supple.  Skin:    General: Skin is warm.     Comments: Right and left BREAST exam [in the presence of nurse]- no unusual skin changes or dominant masses felt. Surgical scars noted.    Neurological:     Mental Status: She is alert and oriented to person, place, and time.  Psychiatric:        Mood and Affect: Affect normal.      LABORATORY DATA:  I have reviewed the data as listed Lab Results  Component Value Date   WBC 5.0 10/08/2021    HGB 13.1 10/08/2021   HCT 39.9 10/08/2021   MCV 96.6 10/08/2021   PLT 220 10/08/2021   No results for input(s): NA, K, CL, CO2, GLUCOSE, BUN, CREATININE, CALCIUM, GFRNONAA, GFRAA, PROT, ALBUMIN, AST, ALT, ALKPHOS, BILITOT, BILIDIR, IBILI in the last 8760 hours.   RADIOGRAPHIC STUDIES: I have personally reviewed the radiological images as listed and agreed with the findings in the report. No results found.  ASSESSMENT & PLAN:   Ductal carcinoma in situ (DCIS) of left breast # Left breast DCIS- ER positive; early stage [2016; patient initially reported as invasive cancer no records available].  However after review of records patient noted to have left breast DCIS.  S/p radiation. Arimidex  [started AUG 2016; [place] ].  Discontinue anastrozole  in JAN 2024.  July 2024-bilateral mammogram negative for any recurrence.  # Clinical exam no evidence of recurrence. Patient prefers breast exam in cancer center, so will continue to follow up on yearly basis. Will order mammo in July 2025.    # Osteoporosis [as per patient]/ BMD-Fosamax [per PCP].  Continue calcium plus vitamin D.  # Genetics: s/p genetics evaluation; pt is interested;-referral made however patient declined sec to cost issues.   # DISPOSITION:  # mammo in July 2025.   # follow up in 12  months- MD; no labs- -Dr.B   All questions were answered. The patient/family knows to call the clinic with any problems, questions or concerns.    Tina JONELLE Joe, MD 04/17/2023 1:33 PM

## 2023-05-10 DIAGNOSIS — R399 Unspecified symptoms and signs involving the genitourinary system: Secondary | ICD-10-CM | POA: Diagnosis not present

## 2023-05-18 DIAGNOSIS — H2513 Age-related nuclear cataract, bilateral: Secondary | ICD-10-CM | POA: Diagnosis not present

## 2023-05-18 DIAGNOSIS — H532 Diplopia: Secondary | ICD-10-CM | POA: Diagnosis not present

## 2023-05-18 DIAGNOSIS — H40009 Preglaucoma, unspecified, unspecified eye: Secondary | ICD-10-CM | POA: Diagnosis not present

## 2023-05-18 DIAGNOSIS — H04123 Dry eye syndrome of bilateral lacrimal glands: Secondary | ICD-10-CM | POA: Diagnosis not present

## 2023-05-25 DIAGNOSIS — M5412 Radiculopathy, cervical region: Secondary | ICD-10-CM | POA: Diagnosis not present

## 2023-05-25 DIAGNOSIS — M9901 Segmental and somatic dysfunction of cervical region: Secondary | ICD-10-CM | POA: Diagnosis not present

## 2023-05-25 DIAGNOSIS — M5033 Other cervical disc degeneration, cervicothoracic region: Secondary | ICD-10-CM | POA: Diagnosis not present

## 2023-05-25 DIAGNOSIS — M9902 Segmental and somatic dysfunction of thoracic region: Secondary | ICD-10-CM | POA: Diagnosis not present

## 2023-05-30 DIAGNOSIS — M25551 Pain in right hip: Secondary | ICD-10-CM | POA: Diagnosis not present

## 2023-05-30 DIAGNOSIS — Z0000 Encounter for general adult medical examination without abnormal findings: Secondary | ICD-10-CM | POA: Diagnosis not present

## 2023-05-30 DIAGNOSIS — M810 Age-related osteoporosis without current pathological fracture: Secondary | ICD-10-CM | POA: Diagnosis not present

## 2023-05-30 DIAGNOSIS — Z23 Encounter for immunization: Secondary | ICD-10-CM | POA: Diagnosis not present

## 2023-05-30 DIAGNOSIS — E039 Hypothyroidism, unspecified: Secondary | ICD-10-CM | POA: Diagnosis not present

## 2023-05-30 DIAGNOSIS — Z853 Personal history of malignant neoplasm of breast: Secondary | ICD-10-CM | POA: Diagnosis not present

## 2023-06-06 DIAGNOSIS — H2512 Age-related nuclear cataract, left eye: Secondary | ICD-10-CM | POA: Diagnosis not present

## 2023-06-09 DIAGNOSIS — M25551 Pain in right hip: Secondary | ICD-10-CM | POA: Diagnosis not present

## 2023-06-12 ENCOUNTER — Encounter: Payer: Self-pay | Admitting: Ophthalmology

## 2023-06-13 DIAGNOSIS — M810 Age-related osteoporosis without current pathological fracture: Secondary | ICD-10-CM | POA: Diagnosis not present

## 2023-06-13 NOTE — Anesthesia Preprocedure Evaluation (Addendum)
 Anesthesia Evaluation  Patient identified by MRN, date of birth, ID band Patient awake    Reviewed: Allergy & Precautions, H&P , NPO status , Patient's Chart, lab work & pertinent test results  Airway Mallampati: III  TM Distance: >3 FB Neck ROM: Full    Dental no notable dental hx.    Pulmonary neg pulmonary ROS, former smoker   Pulmonary exam normal breath sounds clear to auscultation       Cardiovascular negative cardio ROS Normal cardiovascular exam Rhythm:Regular Rate:Normal     Neuro/Psych Seizures -,  PSYCHIATRIC DISORDERS Anxiety Depression    negative neurological ROS  negative psych ROS   GI/Hepatic negative GI ROS, Neg liver ROS,GERD  ,,  Endo/Other  negative endocrine ROSHypothyroidism    Renal/GU negative Renal ROS  negative genitourinary   Musculoskeletal negative musculoskeletal ROS (+)    Abdominal   Peds negative pediatric ROS (+)  Hematology negative hematology ROS (+)   Anesthesia Other Findings Anxiety  History of cancer Seizures (HCC)  Hyperlipemia Thyroid disease  Osteoporosis Mitral valve prolapse  Family history of breast cancer Personal history of radiation therapy GERD (gastroesophageal reflux disease)    Reproductive/Obstetrics negative OB ROS                             Anesthesia Physical Anesthesia Plan  ASA: 2  Anesthesia Plan: MAC   Post-op Pain Management:    Induction: Intravenous  PONV Risk Score and Plan:   Airway Management Planned: Natural Airway and Nasal Cannula  Additional Equipment:   Intra-op Plan:   Post-operative Plan:   Informed Consent: I have reviewed the patients History and Physical, chart, labs and discussed the procedure including the risks, benefits and alternatives for the proposed anesthesia with the patient or authorized representative who has indicated his/her understanding and acceptance.     Dental  Advisory Given  Plan Discussed with: Anesthesiologist, CRNA and Surgeon  Anesthesia Plan Comments: (Patient consented for risks of anesthesia including but not limited to:  - adverse reactions to medications - damage to eyes, teeth, lips or other oral mucosa - nerve damage due to positioning  - sore throat or hoarseness - Damage to heart, brain, nerves, lungs, other parts of body or loss of life  Patient voiced understanding and assent.)       Anesthesia Quick Evaluation

## 2023-06-19 NOTE — Discharge Instructions (Signed)

## 2023-06-21 ENCOUNTER — Other Ambulatory Visit: Payer: Self-pay

## 2023-06-21 ENCOUNTER — Encounter: Payer: Self-pay | Admitting: Ophthalmology

## 2023-06-21 ENCOUNTER — Ambulatory Visit
Admission: RE | Admit: 2023-06-21 | Discharge: 2023-06-21 | Disposition: A | Discharge status: Home / Self Care | Payer: Medicare HMO | Attending: Ophthalmology | Admitting: Ophthalmology

## 2023-06-21 ENCOUNTER — Ambulatory Visit: Payer: Self-pay | Admitting: Anesthesiology

## 2023-06-21 ENCOUNTER — Encounter
Admission: RE | Disposition: A | Discharge status: Home / Self Care | Payer: Self-pay | Source: Home / Self Care | Attending: Ophthalmology

## 2023-06-21 DIAGNOSIS — K219 Gastro-esophageal reflux disease without esophagitis: Secondary | ICD-10-CM | POA: Diagnosis not present

## 2023-06-21 DIAGNOSIS — Z79890 Hormone replacement therapy (postmenopausal): Secondary | ICD-10-CM | POA: Diagnosis not present

## 2023-06-21 DIAGNOSIS — F419 Anxiety disorder, unspecified: Secondary | ICD-10-CM | POA: Insufficient documentation

## 2023-06-21 DIAGNOSIS — H269 Unspecified cataract: Secondary | ICD-10-CM | POA: Diagnosis not present

## 2023-06-21 DIAGNOSIS — I341 Nonrheumatic mitral (valve) prolapse: Secondary | ICD-10-CM | POA: Insufficient documentation

## 2023-06-21 DIAGNOSIS — Z79899 Other long term (current) drug therapy: Secondary | ICD-10-CM | POA: Diagnosis not present

## 2023-06-21 DIAGNOSIS — H2512 Age-related nuclear cataract, left eye: Secondary | ICD-10-CM | POA: Diagnosis not present

## 2023-06-21 DIAGNOSIS — E785 Hyperlipidemia, unspecified: Secondary | ICD-10-CM | POA: Diagnosis not present

## 2023-06-21 DIAGNOSIS — F32A Depression, unspecified: Secondary | ICD-10-CM | POA: Diagnosis not present

## 2023-06-21 DIAGNOSIS — Z87891 Personal history of nicotine dependence: Secondary | ICD-10-CM | POA: Diagnosis not present

## 2023-06-21 HISTORY — PX: CATARACT EXTRACTION W/PHACO: SHX586

## 2023-06-21 HISTORY — DX: Gastro-esophageal reflux disease without esophagitis: K21.9

## 2023-06-21 SURGERY — PHACOEMULSIFICATION, CATARACT, WITH IOL INSERTION
Anesthesia: Monitor Anesthesia Care | Site: Eye | Laterality: Left

## 2023-06-21 MED ORDER — CEFUROXIME OPHTHALMIC INJECTION 1 MG/0.1 ML
INJECTION | OPHTHALMIC | Status: DC | PRN
  Administered 2023-06-21: .1 mL via OPHTHALMIC

## 2023-06-21 MED ORDER — SIGHTPATH DOSE#1 BSS IO SOLN
INTRAOCULAR | Status: DC | PRN
  Administered 2023-06-21: 15 mL

## 2023-06-21 MED ORDER — MIDAZOLAM HCL 2 MG/2ML IJ SOLN
INTRAMUSCULAR | Status: AC
  Filled 2023-06-21: qty 2

## 2023-06-21 MED ORDER — SIGHTPATH DOSE#1 BSS IO SOLN
INTRAOCULAR | Status: DC | PRN
  Administered 2023-06-21: 51 mL via OPHTHALMIC

## 2023-06-21 MED ORDER — BRIMONIDINE TARTRATE-TIMOLOL 0.2-0.5 % OP SOLN
OPHTHALMIC | Status: DC | PRN
  Administered 2023-06-21: 1 [drp] via OPHTHALMIC

## 2023-06-21 MED ORDER — MIDAZOLAM HCL 2 MG/2ML IJ SOLN
INTRAMUSCULAR | Status: DC | PRN
  Administered 2023-06-21: 1 mg via INTRAVENOUS

## 2023-06-21 MED ORDER — FENTANYL CITRATE (PF) 100 MCG/2ML IJ SOLN
INTRAMUSCULAR | Status: DC | PRN
  Administered 2023-06-21: 50 ug via INTRAVENOUS

## 2023-06-21 MED ORDER — ARMC OPHTHALMIC DILATING DROPS
1.0000 | OPHTHALMIC | Status: DC | PRN
  Administered 2023-06-21 (×3): 1 via OPHTHALMIC

## 2023-06-21 MED ORDER — TETRACAINE HCL 0.5 % OP SOLN
OPHTHALMIC | Status: AC
  Filled 2023-06-21: qty 4

## 2023-06-21 MED ORDER — ARMC OPHTHALMIC DILATING DROPS
OPHTHALMIC | Status: AC
  Filled 2023-06-21: qty 0.5

## 2023-06-21 MED ORDER — LIDOCAINE HCL (PF) 2 % IJ SOLN
INTRAOCULAR | Status: DC | PRN
  Administered 2023-06-21: 1 mL via INTRAMUSCULAR

## 2023-06-21 MED ORDER — SIGHTPATH DOSE#1 NA HYALUR & NA CHOND-NA HYALUR IO KIT
PACK | INTRAOCULAR | Status: DC | PRN
  Administered 2023-06-21: 1 via OPHTHALMIC

## 2023-06-21 MED ORDER — FENTANYL CITRATE (PF) 100 MCG/2ML IJ SOLN
INTRAMUSCULAR | Status: AC
  Filled 2023-06-21: qty 2

## 2023-06-21 MED ORDER — TETRACAINE HCL 0.5 % OP SOLN
1.0000 [drp] | OPHTHALMIC | Status: DC | PRN
  Administered 2023-06-21 (×4): 1 [drp] via OPHTHALMIC

## 2023-06-21 SURGICAL SUPPLY — 10 items
CATARACT SUITE SIGHTPATH (MISCELLANEOUS) ×1 IMPLANT
FEE CATARACT SUITE SIGHTPATH (MISCELLANEOUS) ×1 IMPLANT
GLOVE BIOGEL PI IND STRL 8 (GLOVE) ×1 IMPLANT
GLOVE SURG LX STRL 7.5 STRW (GLOVE) ×1 IMPLANT
GLOVE SURG PROTEXIS BL SZ6.5 (GLOVE) ×1 IMPLANT
GLOVE SURG SYN 6.5 PF PI BL (GLOVE) ×1 IMPLANT
LENS IOL TECNIS EYHANCE 19.5 (Intraocular Lens) IMPLANT
NDL FILTER BLUNT 18X1 1/2 (NEEDLE) ×1 IMPLANT
NEEDLE FILTER BLUNT 18X1 1/2 (NEEDLE) ×1 IMPLANT
SYR 3ML LL SCALE MARK (SYRINGE) ×1 IMPLANT

## 2023-06-21 NOTE — Anesthesia Postprocedure Evaluation (Signed)
 Anesthesia Post Note  Patient: Tina Armstrong  Procedure(s) Performed: CATARACT EXTRACTION PHACO AND INTRAOCULAR LENS PLACEMENT (IOC) LEFT  8.47  00:37.2 (Left: Eye)  Patient location during evaluation: PACU Anesthesia Type: MAC Level of consciousness: awake and alert Pain management: pain level controlled Vital Signs Assessment: post-procedure vital signs reviewed and stable Respiratory status: spontaneous breathing, nonlabored ventilation, respiratory function stable and patient connected to nasal cannula oxygen Cardiovascular status: stable and blood pressure returned to baseline Postop Assessment: no apparent nausea or vomiting Anesthetic complications: no   No notable events documented.   Last Vitals:  Vitals:   06/21/23 1141 06/21/23 1144  BP:  103/68  Pulse: (!) 56 (!) 58  Resp: 10 18  Temp:    SpO2: 97% 97%    Last Pain:  Vitals:   06/21/23 1144  TempSrc:   PainSc: 0-No pain                 Kashius Dominic C Quinten Allerton

## 2023-06-21 NOTE — H&P (Signed)
 [facility]   Primary Care Physician:  Dorothey Baseman, MD Ophthalmologist: Dr. Lockie Mola  Pre-Procedure History & Physical: HPI:  Tina Armstrong is a 78 y.o. female here for ophthalmic surgery.   Past Medical History:  Diagnosis Date   Anxiety    Family history of breast cancer    GERD (gastroesophageal reflux disease)    History of cancer    breast   Hyperlipemia    Mitral valve prolapse    Osteoporosis    Personal history of radiation therapy    breast   Seizures (HCC)    x2. age 32.  Medicine d/c'd at age 54.  No recurrence   Thyroid disease     Past Surgical History:  Procedure Laterality Date   ABDOMINAL HYSTERECTOMY     total   APPENDECTOMY     BREAST EXCISIONAL BIOPSY Left 1995   BREAST LUMPECTOMY Left 2016   MASTECTOMY PARTIAL / LUMPECTOMY Left 04/10/2016   MASTECTOMY, PARTIAL Left 04/12/2015    Prior to Admission medications   Medication Sig Start Date End Date Taking? Authorizing Provider  alendronate (FOSAMAX) 70 MG tablet Take 70 mg by mouth once a week.   Yes [provider]  aspirin EC 81 MG tablet Take 81 mg by mouth daily. Swallow whole.   Yes [provider]  atorvastatin (LIPITOR) 20 MG tablet Take 1 tablet by mouth daily. 09/23/19  Yes [provider]  Calcium Carb-Cholecalciferol (CALCIUM 1000 + D PO) Take 1 tablet by mouth daily.   Yes [provider]  Cholecalciferol 25 MCG (1000 UT) capsule Take 1,000 Units by mouth daily.   Yes [provider]  cyanocobalamin 1000 MCG tablet Take 1,000 mcg by mouth daily.   Yes [provider]  ferrous sulfate 325 (65 FE) MG tablet Take 325 mg by mouth daily.   Yes [provider]  gabapentin (NEURONTIN) 300 MG capsule Take 300 mg by mouth 2 (two) times daily. 09/16/19  Yes [provider]  levothyroxine (SYNTHROID) 100 MCG tablet Take 100 mcg by mouth daily.   Yes [provider]  pantoprazole (PROTONIX) 20 MG  tablet Take 20 mg by mouth 2 (two) times daily.   Yes [provider]  potassium gluconate 595 (99 K) MG TABS tablet Take 595 mg by mouth daily.   Yes [provider]  zinc gluconate 50 MG tablet Take 50 mg by mouth daily.   Yes [provider]    Allergies as of 05/22/2023 - Review Complete 04/17/2023  Allergen Reaction Noted   Atenolol Other (See Comments) 12/01/2020   Diltiazem Other (See Comments) 12/01/2020    Family History  Problem Relation Age of Onset   Breast cancer Mother 43   Heart attack Father    Heart attack Brother    Osteoporosis Paternal Grandmother    Heart attack Brother    Heart attack Brother     Social History   Socioeconomic History   Marital status: Widowed    Spouse name: Not on file   Number of children: Not on file   Years of education: Not on file   Highest education level: Not on file  Occupational History   Not on file  Tobacco Use   Smoking status: Former    Current packs/day: 0.00    Average packs/day: 0.2 packs/day for 5.0 years (0.8 ttl pk-yrs)    Types: Cigarettes    Start date: 2002    Quit date: 2007    Years  since quitting: 18.2   Smokeless tobacco: Never  Vaping Use   Vaping status: Never Used  Substance and Sexual Activity   Alcohol use: Yes    Alcohol/week: 3.0 standard drinks of alcohol    Types: 3 Glasses of wine per week   Drug use: Never   Sexual activity: Not on file  Other Topics Concern   Not on file  Social History Narrative   Moved from [place] at age of 12 years; moved from [place] to [place] for family reasons- lives in [place] daughter.  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 Was in Social research officer, government.    Social Drivers of Corporate investment banker Strain: Low Risk  (05/30/2023)   Received from [facility]   Overall Financial Resource Strain (CARDIA)    Difficulty of Paying Living Expenses: Not hard at all  Food Insecurity: No Food Insecurity (05/30/2023)   Received  from [facility]   Hunger Vital Sign    Worried About Running Out of Food in the Last Year: Never true    Ran Out of Food in the Last Year: Never true  Transportation Needs: No Transportation Needs (05/30/2023)   Received from [facility] - Transportation    In the past 12 months, has lack of transportation kept you from medical appointments or from getting medications?: No    Lack of Transportation (Non-Medical): No  Physical Activity: Not on file  Stress: Not on file  Social Connections: Not on file  Intimate Partner Violence: Not on file    Review of Systems: See HPI, otherwise negative ROS  Physical Exam: BP 115/66   Pulse 97   Resp 12   Ht 5' 4.02" (1.626 m)   Wt 58 kg   SpO2 96%   BMI 21.94 kg/m  General:   Alert,  pleasant and cooperative in NAD Head:  Normocephalic and atraumatic. Lungs:  Clear to auscultation.    Heart:  Regular rate and rhythm.   Impression/Plan: Tina Armstrong is here for ophthalmic surgery.  Risks, benefits, limitations, and alternatives regarding ophthalmic surgery have been reviewed with the patient.  Questions have been answered.  All parties agreeable.   Lockie Mola, MD  06/21/2023, 10:31 AM

## 2023-06-21 NOTE — Transfer of Care (Signed)
 Immediate Anesthesia Transfer of Care Note  Patient: Tina Armstrong  Procedure(s) Performed: CATARACT EXTRACTION PHACO AND INTRAOCULAR LENS PLACEMENT (IOC) LEFT  8.47  00:37.2 (Left: Eye)  Patient Location: PACU  Anesthesia Type: MAC  Level of Consciousness: awake, alert  and patient cooperative  Airway and Oxygen Therapy: Patient Spontanous Breathing and Patient connected to supplemental oxygen  Post-op Assessment: Post-op Vital signs reviewed, Patient's Cardiovascular Status Stable, Respiratory Function Stable, Patent Airway and No signs of Nausea or vomiting  Post-op Vital Signs: Reviewed and stable  Complications: No notable events documented.

## 2023-06-21 NOTE — Op Note (Signed)
 OPERATIVE NOTE  Tina Armstrong [id number] 06/21/2023   PREOPERATIVE DIAGNOSIS:  Nuclear sclerotic cataract left eye. H25.12   POSTOPERATIVE DIAGNOSIS:    Nuclear sclerotic cataract left eye.     PROCEDURE:  Phacoemusification with posterior chamber intraocular lens placement of the left eye  Ultrasound time: Procedure(s): CATARACT EXTRACTION PHACO AND INTRAOCULAR LENS PLACEMENT (IOC) LEFT  8.47  00:37.2 (Left)  LENS:   Implant Name Type Inv. Item Serial No. Manufacturer Lot No. LRB No. Used Action  LENS IOL TECNIS EYHANCE 19.5 - [id number] Intraocular Lens LENS IOL TECNIS EYHANCE 19.5 [id number] [employer]  Left 1 Implanted      SURGEON:  Deirdre Evener, MD   ANESTHESIA:  Topical with tetracaine drops and 2% Xylocaine jelly, augmented with 1% preservative-free intracameral lidocaine.    COMPLICATIONS:  None.   DESCRIPTION OF PROCEDURE:  The patient was identified in the holding room and transported to the operating room and placed in the supine position under the operating microscope.  The left eye was identified as the operative eye and it was prepped and draped in the usual sterile ophthalmic fashion.   A 1 millimeter clear-corneal paracentesis was made at the 1:30 position.  0.5 ml of preservative-free 1% lidocaine was injected into the anterior chamber.  The anterior chamber was filled with Viscoat viscoelastic.  A 2.4 millimeter keratome was used to make a near-clear corneal incision at the 10:30 position.  .  A curvilinear capsulorrhexis was made with a cystotome and capsulorrhexis forceps.  Balanced salt solution was used to hydrodissect and hydrodelineate the nucleus.   Phacoemulsification was then used in stop and chop fashion to remove the lens nucleus and epinucleus.  The remaining cortex was then removed using the irrigation and aspiration handpiece. Provisc was then placed into the capsular bag to distend it for lens placement.  A lens was then injected into the  capsular bag.  The remaining viscoelastic was aspirated.   Wounds were hydrated with balanced salt solution.  The anterior chamber was inflated to a physiologic pressure with balanced salt solution.  No wound leaks were noted. Cefuroxime 0.1 ml of a 10mg /ml solution was injected into the anterior chamber for a dose of 1 mg of intracameral antibiotic at the completion of the case.   Timolol and Brimonidine drops were applied to the eye.  The patient was taken to the recovery room in stable condition without complications of anesthesia or surgery.  [name] 06/21/2023, 11:38 AM

## 2023-06-22 ENCOUNTER — Encounter: Payer: Self-pay | Admitting: Ophthalmology

## 2023-06-22 DIAGNOSIS — H2511 Age-related nuclear cataract, right eye: Secondary | ICD-10-CM | POA: Diagnosis not present

## 2023-06-26 NOTE — Anesthesia Preprocedure Evaluation (Addendum)
 Anesthesia Evaluation  Patient identified by MRN, date of birth, ID band Patient awake    Reviewed: Allergy & Precautions, H&P , NPO status , Patient's Chart, lab work & pertinent test results  Airway Mallampati: III  TM Distance: >3 FB Neck ROM: Full    Dental no notable dental hx.    Pulmonary neg pulmonary ROS, former smoker   Pulmonary exam normal breath sounds clear to auscultation       Cardiovascular negative cardio ROS Normal cardiovascular exam Rhythm:Regular Rate:Normal     Neuro/Psych Seizures -,  PSYCHIATRIC DISORDERS Anxiety Depression    negative neurological ROS  negative psych ROS   GI/Hepatic negative GI ROS, Neg liver ROS,GERD  ,,  Endo/Other  negative endocrine ROSHypothyroidism    Renal/GU negative Renal ROS  negative genitourinary   Musculoskeletal negative musculoskeletal ROS (+)    Abdominal   Peds negative pediatric ROS (+)  Hematology negative hematology ROS (+)   Anesthesia Other Findings Previous cataract surgery 06-21-23 Dr. Juel Burrow anesthesiologist  Reproductive/Obstetrics negative OB ROS                              Anesthesia Physical Anesthesia Plan  ASA: 3  Anesthesia Plan: MAC   Post-op Pain Management:    Induction: Intravenous  PONV Risk Score and Plan:   Airway Management Planned: Natural Airway and Nasal Cannula  Additional Equipment:   Intra-op Plan:   Post-operative Plan:   Informed Consent: I have reviewed the patients History and Physical, chart, labs and discussed the procedure including the risks, benefits and alternatives for the proposed anesthesia with the patient or authorized representative who has indicated his/her understanding and acceptance.     Dental Advisory Given  Plan Discussed with: Anesthesiologist, CRNA and Surgeon  Anesthesia Plan Comments: (Patient consented for risks of anesthesia including but not  limited to:  - adverse reactions to medications - damage to eyes, teeth, lips or other oral mucosa - nerve damage due to positioning  - sore throat or hoarseness - Damage to heart, brain, nerves, lungs, other parts of body or loss of life  Patient voiced understanding and assent.)         Anesthesia Quick Evaluation

## 2023-06-27 DIAGNOSIS — M79645 Pain in left finger(s): Secondary | ICD-10-CM | POA: Diagnosis not present

## 2023-07-03 NOTE — Discharge Instructions (Signed)

## 2023-07-05 ENCOUNTER — Encounter: Payer: Self-pay | Admitting: Ophthalmology

## 2023-07-05 ENCOUNTER — Ambulatory Visit: Payer: Self-pay | Admitting: Anesthesiology

## 2023-07-05 ENCOUNTER — Ambulatory Visit
Admission: RE | Admit: 2023-07-05 | Discharge: 2023-07-05 | Disposition: A | Discharge status: Home / Self Care | Payer: Medicare HMO | Attending: Ophthalmology | Admitting: Ophthalmology

## 2023-07-05 ENCOUNTER — Other Ambulatory Visit: Payer: Self-pay

## 2023-07-05 ENCOUNTER — Encounter
Admission: RE | Disposition: A | Discharge status: Home / Self Care | Payer: Self-pay | Source: Home / Self Care | Attending: Ophthalmology

## 2023-07-05 DIAGNOSIS — K219 Gastro-esophageal reflux disease without esophagitis: Secondary | ICD-10-CM | POA: Diagnosis not present

## 2023-07-05 DIAGNOSIS — H2511 Age-related nuclear cataract, right eye: Secondary | ICD-10-CM | POA: Diagnosis not present

## 2023-07-05 DIAGNOSIS — H269 Unspecified cataract: Secondary | ICD-10-CM | POA: Diagnosis not present

## 2023-07-05 DIAGNOSIS — Z87891 Personal history of nicotine dependence: Secondary | ICD-10-CM | POA: Insufficient documentation

## 2023-07-05 DIAGNOSIS — E039 Hypothyroidism, unspecified: Secondary | ICD-10-CM | POA: Diagnosis not present

## 2023-07-05 DIAGNOSIS — F418 Other specified anxiety disorders: Secondary | ICD-10-CM | POA: Diagnosis not present

## 2023-07-05 HISTORY — PX: CATARACT EXTRACTION W/PHACO: SHX586

## 2023-07-05 SURGERY — PHACOEMULSIFICATION, CATARACT, WITH IOL INSERTION
Anesthesia: Monitor Anesthesia Care | Site: Eye | Laterality: Right

## 2023-07-05 MED ORDER — FENTANYL CITRATE (PF) 100 MCG/2ML IJ SOLN
INTRAMUSCULAR | Status: AC
  Filled 2023-07-05: qty 2

## 2023-07-05 MED ORDER — TETRACAINE HCL 0.5 % OP SOLN
1.0000 [drp] | OPHTHALMIC | Status: DC | PRN
  Administered 2023-07-05 (×3): 1 [drp] via OPHTHALMIC

## 2023-07-05 MED ORDER — BRIMONIDINE TARTRATE-TIMOLOL 0.2-0.5 % OP SOLN
OPHTHALMIC | Status: DC | PRN
  Administered 2023-07-05: 1 [drp] via OPHTHALMIC

## 2023-07-05 MED ORDER — LIDOCAINE HCL (PF) 2 % IJ SOLN
INTRAOCULAR | Status: DC | PRN
  Administered 2023-07-05: 2 mL

## 2023-07-05 MED ORDER — CEFUROXIME OPHTHALMIC INJECTION 1 MG/0.1 ML
INJECTION | OPHTHALMIC | Status: DC | PRN
Start: 2023-07-05 — End: 2023-07-05
  Administered 2023-07-05: 1 mg via INTRACAMERAL

## 2023-07-05 MED ORDER — SIGHTPATH DOSE#1 BSS IO SOLN
INTRAOCULAR | Status: DC | PRN
  Administered 2023-07-05: 15 mL via INTRAOCULAR

## 2023-07-05 MED ORDER — ARMC OPHTHALMIC DILATING DROPS
1.0000 | OPHTHALMIC | Status: DC | PRN
  Administered 2023-07-05 (×3): 1 via OPHTHALMIC

## 2023-07-05 MED ORDER — ARMC OPHTHALMIC DILATING DROPS
OPHTHALMIC | Status: AC
  Filled 2023-07-05: qty 0.5

## 2023-07-05 MED ORDER — SIGHTPATH DOSE#1 NA HYALUR & NA CHOND-NA HYALUR IO KIT
PACK | INTRAOCULAR | Status: DC | PRN
  Administered 2023-07-05: 1 via OPHTHALMIC

## 2023-07-05 MED ORDER — FENTANYL CITRATE (PF) 100 MCG/2ML IJ SOLN
INTRAMUSCULAR | Status: DC | PRN
  Administered 2023-07-05: 50 ug via INTRAVENOUS

## 2023-07-05 MED ORDER — MIDAZOLAM HCL 2 MG/2ML IJ SOLN
INTRAMUSCULAR | Status: DC | PRN
Start: 2023-07-05 — End: 2023-07-05
  Administered 2023-07-05 (×2): 1 mg via INTRAVENOUS

## 2023-07-05 MED ORDER — TETRACAINE HCL 0.5 % OP SOLN
OPHTHALMIC | Status: AC
  Filled 2023-07-05: qty 4

## 2023-07-05 MED ORDER — MIDAZOLAM HCL 2 MG/2ML IJ SOLN
INTRAMUSCULAR | Status: AC
  Filled 2023-07-05: qty 2

## 2023-07-05 MED ORDER — SIGHTPATH DOSE#1 BSS IO SOLN
INTRAOCULAR | Status: DC | PRN
  Administered 2023-07-05: 58 mL via OPHTHALMIC

## 2023-07-05 SURGICAL SUPPLY — 10 items

## 2023-07-05 NOTE — Anesthesia Postprocedure Evaluation (Signed)
 Anesthesia Post Note  Patient: Tina Armstrong  Procedure(s) Performed: CATARACT EXTRACTION PHACO AND INTRAOCULAR LENS PLACEMENT (IOC) RIGHT 6.20 00:37.2 (Right: Eye)  Patient location during evaluation: PACU Anesthesia Type: MAC Level of consciousness: awake and alert Pain management: pain level controlled Vital Signs Assessment: post-procedure vital signs reviewed and stable Respiratory status: spontaneous breathing, nonlabored ventilation, respiratory function stable and patient connected to nasal cannula oxygen Cardiovascular status: stable and blood pressure returned to baseline Postop Assessment: no apparent nausea or vomiting Anesthetic complications: no   No notable events documented.   Last Vitals:  Vitals:   07/05/23 1204 07/05/23 1207  BP: 93/68 (!) 96/55  Pulse: (!) 57 (!) 56  Resp: 15 17  Temp: (!) 36.3 C (!) 36.3 C  SpO2: 100% 98%    Last Pain:  Vitals:   07/05/23 1207  TempSrc:   PainSc: 0-No pain                 Kathee Tumlin C Will Schier

## 2023-07-05 NOTE — Transfer of Care (Signed)
 Immediate Anesthesia Transfer of Care Note  Patient: Tina Armstrong  Procedure(s) Performed: CATARACT EXTRACTION PHACO AND INTRAOCULAR LENS PLACEMENT (IOC) RIGHT 6.20 00:37.2 (Right: Eye)  Patient Location: PACU  Anesthesia Type: MAC  Level of Consciousness: awake, alert  and patient cooperative  Airway and Oxygen Therapy: Patient Spontanous Breathing and Patient connected to supplemental oxygen  Post-op Assessment: Post-op Vital signs reviewed, Patient's Cardiovascular Status Stable, Respiratory Function Stable, Patent Airway and No signs of Nausea or vomiting  Post-op Vital Signs: Reviewed and stable  Complications: No notable events documented.

## 2023-07-05 NOTE — Op Note (Signed)
 LOCATION:  [facility]   PREOPERATIVE DIAGNOSIS:    Nuclear sclerotic cataract right eye. H25.11   POSTOPERATIVE DIAGNOSIS:  Nuclear sclerotic cataract right eye.     PROCEDURE:  Phacoemusification with posterior chamber intraocular lens placement of the right eye   ULTRASOUND TIME: Procedure(s): CATARACT EXTRACTION PHACO AND INTRAOCULAR LENS PLACEMENT (IOC) RIGHT 6.20 00:37.2 (Right)  LENS:   Implant Name Type Inv. Item Serial No. Manufacturer Lot No. LRB No. Used Action  LENS IOL TECNIS EYHANCE 20.0 - [id number] Intraocular Lens LENS IOL TECNIS EYHANCE 20.0 [id number] [employer]  Right 1 Implanted         SURGEON:  Deirdre Evener, MD   ANESTHESIA:  Topical with tetracaine drops and 2% Xylocaine jelly, augmented with 1% preservative-free intracameral lidocaine.    COMPLICATIONS:  None.   DESCRIPTION OF PROCEDURE:  The patient was identified in the holding room and transported to the operating room and placed in the supine position under the operating microscope.  The right eye was identified as the operative eye and it was prepped and draped in the usual sterile ophthalmic fashion.   A 1 millimeter clear-corneal paracentesis was made at the 12:00 position.  0.5 ml of preservative-free 1% lidocaine was injected into the anterior chamber. The anterior chamber was filled with Viscoat viscoelastic.  A 2.4 millimeter keratome was used to make a near-clear corneal incision at the 9:00 position.  A curvilinear capsulorrhexis was made with a cystotome and capsulorrhexis forceps.  Balanced salt solution was used to hydrodissect and hydrodelineate the nucleus.   Phacoemulsification was then used in stop and chop fashion to remove the lens nucleus and epinucleus.  The remaining cortex was then removed using the irrigation and aspiration handpiece. Provisc was then placed into the capsular bag to distend it for lens placement.  A lens was then injected into the capsular bag.  The  remaining viscoelastic was aspirated.   Wounds were hydrated with balanced salt solution.  The anterior chamber was inflated to a physiologic pressure with balanced salt solution.  No wound leaks were noted. Cefuroxime 0.1 ml of a 10mg /ml solution was injected into the anterior chamber for a dose of 1 mg of intracameral antibiotic at the completion of the case.   Timolol and Brimonidine drops were applied to the eye.  The patient was taken to the recovery room in stable condition without complications of anesthesia or surgery.   Tina Armstrong 07/05/2023, 12:02 PM

## 2023-07-05 NOTE — H&P (Signed)
 [facility]   Primary Care Physician:  Dorothey Baseman, MD Ophthalmologist: Dr. Lockie Mola  Pre-Procedure History & Physical: HPI:  Tina Armstrong is a 78 y.o. female here for ophthalmic surgery.   Past Medical History:  Diagnosis Date   Anxiety    Family history of breast cancer    GERD (gastroesophageal reflux disease)    History of cancer    breast   Hyperlipemia    Mitral valve prolapse    Osteoporosis    Personal history of radiation therapy    breast   Seizures (HCC)    x2. age 43.  Medicine d/c'd at age 38.  No recurrence   Thyroid disease     Past Surgical History:  Procedure Laterality Date   ABDOMINAL HYSTERECTOMY     total   APPENDECTOMY     BREAST EXCISIONAL BIOPSY Left 1995   BREAST LUMPECTOMY Left 2016   CATARACT EXTRACTION W/PHACO Left 06/21/2023   Procedure: CATARACT EXTRACTION PHACO AND INTRAOCULAR LENS PLACEMENT (IOC) LEFT  8.47  00:37.2;  Surgeon: Lockie Mola, MD;  Location: [facility];  Service: Ophthalmology;  Laterality: Left;   MASTECTOMY PARTIAL / LUMPECTOMY Left 04/10/2016   MASTECTOMY, PARTIAL Left 04/12/2015    Prior to Admission medications   Medication Sig Start Date End Date Taking? Authorizing Provider  alendronate (FOSAMAX) 70 MG tablet Take 70 mg by mouth once a week.   Yes [provider]  aspirin EC 81 MG tablet Take 81 mg by mouth daily. Swallow whole.   Yes [provider]  atorvastatin (LIPITOR) 20 MG tablet Take 1 tablet by mouth daily. 09/23/19  Yes [provider]  Calcium Carb-Cholecalciferol (CALCIUM 1000 + D PO) Take 1 tablet by mouth daily.   Yes [provider]  Cholecalciferol 25 MCG (1000 UT) capsule Take 1,000 Units by mouth daily.   Yes [provider]  cyanocobalamin 1000 MCG tablet Take 1,000 mcg by mouth daily.   Yes [provider]  ferrous sulfate 325 (65 FE) MG tablet Take 325 mg by mouth daily.   Yes [provider]   gabapentin (NEURONTIN) 300 MG capsule Take 300 mg by mouth 2 (two) times daily. 09/16/19  Yes [provider]  levothyroxine (SYNTHROID) 100 MCG tablet Take 100 mcg by mouth daily.   Yes [provider]  pantoprazole (PROTONIX) 20 MG tablet Take 20 mg by mouth 2 (two) times daily.   Yes [provider]  zinc gluconate 50 MG tablet Take 50 mg by mouth daily.   Yes [provider]  potassium gluconate 595 (99 K) MG TABS tablet Take 595 mg by mouth daily.    [provider]    Allergies as of 05/22/2023 - Review Complete 04/17/2023  Allergen Reaction Noted   Atenolol Other (See Comments) 12/01/2020   Diltiazem Other (See Comments) 12/01/2020    Family History  Problem Relation Age of Onset   Breast cancer Mother 22   Heart attack Father    Heart attack Brother    Osteoporosis Paternal Grandmother    Heart attack Brother    Heart attack Brother     Social History   Socioeconomic History   Marital status: Widowed    Spouse name: Not on file   Number of children: Not on file   Years of education: Not on file   Highest education level: Not on file  Occupational History   Not on file  Tobacco Use   Smoking status: Former  Current packs/day: 0.00    Average packs/day: 0.2 packs/day for 5.0 years (0.8 ttl pk-yrs)    Types: Cigarettes    Start date: 2002    Quit date: 2007    Years since quitting: 18.2   Smokeless tobacco: Never  Vaping Use   Vaping status: Never Used  Substance and Sexual Activity   Alcohol use: Yes    Alcohol/week: 3.0 standard drinks of alcohol    Types: 3 Glasses of wine per week   Drug use: Never   Sexual activity: Not on file  Other Topics Concern   Not on file  Social History Narrative   Moved from [place] at age of 12 years; moved from [place] to [place] for family reasons- lives in [place] daughter.  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 Was in Social research officer, government.    Social Drivers of Research scientist (physical sciences) Strain: Low Risk  (05/30/2023)   Received from [facility]   Overall Financial Resource Strain (CARDIA)    Difficulty of Paying Living Expenses: Not hard at all  Food Insecurity: No Food Insecurity (05/30/2023)   Received from [facility]   Hunger Vital Sign    Worried About Running Out of Food in the Last Year: Never true    Ran Out of Food in the Last Year: Never true  Transportation Needs: No Transportation Needs (05/30/2023)   Received from [facility] - Transportation    In the past 12 months, has lack of transportation kept you from medical appointments or from getting medications?: No    Lack of Transportation (Non-Medical): No  Physical Activity: Not on file  Stress: Not on file  Social Connections: Not on file  Intimate Partner Violence: Not on file    Review of Systems: See HPI, otherwise negative ROS  Physical Exam: BP (!) 110/58   Pulse (!) 57   Temp 97.6 F (36.4 C) (Temporal)   Resp 20   Ht 5\' 4"  (1.626 m)   Wt 57.2 kg   SpO2 (!) 57%   BMI 21.63 kg/m  General:   Alert,  pleasant and cooperative in NAD Head:  Normocephalic and atraumatic. Lungs:  Clear to auscultation.    Heart:  Regular rate and rhythm.   Impression/Plan: Eliese Kerwood is here for ophthalmic surgery.  Risks, benefits, limitations, and alternatives regarding ophthalmic surgery have been reviewed with the patient.  Questions have been answered.  All parties agreeable.   Lockie Mola, MD  07/05/2023, 11:19 AM

## 2023-07-06 ENCOUNTER — Encounter: Payer: Self-pay | Admitting: Ophthalmology

## 2023-07-12 DIAGNOSIS — M5412 Radiculopathy, cervical region: Secondary | ICD-10-CM | POA: Diagnosis not present

## 2023-07-12 DIAGNOSIS — M5033 Other cervical disc degeneration, cervicothoracic region: Secondary | ICD-10-CM | POA: Diagnosis not present

## 2023-07-12 DIAGNOSIS — M9902 Segmental and somatic dysfunction of thoracic region: Secondary | ICD-10-CM | POA: Diagnosis not present

## 2023-07-12 DIAGNOSIS — M9901 Segmental and somatic dysfunction of cervical region: Secondary | ICD-10-CM | POA: Diagnosis not present

## 2023-07-20 DIAGNOSIS — R531 Weakness: Secondary | ICD-10-CM | POA: Diagnosis not present

## 2023-07-20 DIAGNOSIS — R2689 Other abnormalities of gait and mobility: Secondary | ICD-10-CM | POA: Diagnosis not present

## 2023-07-20 DIAGNOSIS — R002 Palpitations: Secondary | ICD-10-CM | POA: Diagnosis not present

## 2023-08-21 DIAGNOSIS — E039 Hypothyroidism, unspecified: Secondary | ICD-10-CM | POA: Diagnosis not present

## 2023-09-07 DIAGNOSIS — E039 Hypothyroidism, unspecified: Secondary | ICD-10-CM | POA: Diagnosis not present

## 2023-09-07 DIAGNOSIS — R5383 Other fatigue: Secondary | ICD-10-CM | POA: Diagnosis not present

## 2023-09-07 DIAGNOSIS — R8290 Unspecified abnormal findings in urine: Secondary | ICD-10-CM | POA: Diagnosis not present

## 2023-09-08 DIAGNOSIS — R5383 Other fatigue: Secondary | ICD-10-CM | POA: Diagnosis not present

## 2023-09-08 DIAGNOSIS — E039 Hypothyroidism, unspecified: Secondary | ICD-10-CM | POA: Diagnosis not present

## 2023-09-08 DIAGNOSIS — R8290 Unspecified abnormal findings in urine: Secondary | ICD-10-CM | POA: Diagnosis not present

## 2023-09-11 DIAGNOSIS — H5021 Vertical strabismus, right eye: Secondary | ICD-10-CM | POA: Diagnosis not present

## 2023-09-11 DIAGNOSIS — H43813 Vitreous degeneration, bilateral: Secondary | ICD-10-CM | POA: Diagnosis not present

## 2023-09-11 DIAGNOSIS — H532 Diplopia: Secondary | ICD-10-CM | POA: Diagnosis not present

## 2023-09-11 DIAGNOSIS — Z0100 Encounter for examination of eyes and vision without abnormal findings: Secondary | ICD-10-CM | POA: Diagnosis not present

## 2023-09-11 DIAGNOSIS — H26491 Other secondary cataract, right eye: Secondary | ICD-10-CM | POA: Diagnosis not present

## 2023-09-13 DIAGNOSIS — M5033 Other cervical disc degeneration, cervicothoracic region: Secondary | ICD-10-CM | POA: Diagnosis not present

## 2023-09-13 DIAGNOSIS — M5412 Radiculopathy, cervical region: Secondary | ICD-10-CM | POA: Diagnosis not present

## 2023-09-13 DIAGNOSIS — M9902 Segmental and somatic dysfunction of thoracic region: Secondary | ICD-10-CM | POA: Diagnosis not present

## 2023-09-13 DIAGNOSIS — M9901 Segmental and somatic dysfunction of cervical region: Secondary | ICD-10-CM | POA: Diagnosis not present

## 2023-10-25 DIAGNOSIS — M9901 Segmental and somatic dysfunction of cervical region: Secondary | ICD-10-CM | POA: Diagnosis not present

## 2023-10-25 DIAGNOSIS — M5412 Radiculopathy, cervical region: Secondary | ICD-10-CM | POA: Diagnosis not present

## 2023-10-25 DIAGNOSIS — M9902 Segmental and somatic dysfunction of thoracic region: Secondary | ICD-10-CM | POA: Diagnosis not present

## 2023-10-25 DIAGNOSIS — M5033 Other cervical disc degeneration, cervicothoracic region: Secondary | ICD-10-CM | POA: Diagnosis not present

## 2023-11-06 ENCOUNTER — Ambulatory Visit
Admission: RE | Admit: 2023-11-06 | Discharge: 2023-11-06 | Disposition: A | Discharge status: Home / Self Care | Source: Ambulatory Visit | Attending: Internal Medicine | Admitting: Internal Medicine

## 2023-11-06 DIAGNOSIS — Z1231 Encounter for screening mammogram for malignant neoplasm of breast: Secondary | ICD-10-CM | POA: Diagnosis not present

## 2023-11-06 DIAGNOSIS — D0512 Intraductal carcinoma in situ of left breast: Secondary | ICD-10-CM | POA: Insufficient documentation

## 2023-11-27 DIAGNOSIS — E039 Hypothyroidism, unspecified: Secondary | ICD-10-CM | POA: Diagnosis not present

## 2023-11-27 DIAGNOSIS — G629 Polyneuropathy, unspecified: Secondary | ICD-10-CM | POA: Diagnosis not present

## 2023-11-27 DIAGNOSIS — G2581 Restless legs syndrome: Secondary | ICD-10-CM | POA: Diagnosis not present

## 2023-11-27 DIAGNOSIS — K219 Gastro-esophageal reflux disease without esophagitis: Secondary | ICD-10-CM | POA: Diagnosis not present

## 2023-11-27 DIAGNOSIS — E785 Hyperlipidemia, unspecified: Secondary | ICD-10-CM | POA: Diagnosis not present

## 2023-11-27 DIAGNOSIS — M25569 Pain in unspecified knee: Secondary | ICD-10-CM | POA: Diagnosis not present

## 2023-11-27 DIAGNOSIS — M25561 Pain in right knee: Secondary | ICD-10-CM | POA: Diagnosis not present

## 2023-11-28 DIAGNOSIS — K219 Gastro-esophageal reflux disease without esophagitis: Secondary | ICD-10-CM | POA: Diagnosis not present

## 2023-11-28 DIAGNOSIS — R399 Unspecified symptoms and signs involving the genitourinary system: Secondary | ICD-10-CM | POA: Diagnosis not present

## 2023-11-28 DIAGNOSIS — E7800 Pure hypercholesterolemia, unspecified: Secondary | ICD-10-CM | POA: Diagnosis not present

## 2023-11-28 DIAGNOSIS — E039 Hypothyroidism, unspecified: Secondary | ICD-10-CM | POA: Diagnosis not present

## 2023-11-28 DIAGNOSIS — G629 Polyneuropathy, unspecified: Secondary | ICD-10-CM | POA: Diagnosis not present

## 2023-11-28 DIAGNOSIS — G2581 Restless legs syndrome: Secondary | ICD-10-CM | POA: Diagnosis not present

## 2023-12-07 DIAGNOSIS — M9901 Segmental and somatic dysfunction of cervical region: Secondary | ICD-10-CM | POA: Diagnosis not present

## 2023-12-07 DIAGNOSIS — M9902 Segmental and somatic dysfunction of thoracic region: Secondary | ICD-10-CM | POA: Diagnosis not present

## 2023-12-07 DIAGNOSIS — M5412 Radiculopathy, cervical region: Secondary | ICD-10-CM | POA: Diagnosis not present

## 2023-12-07 DIAGNOSIS — M5033 Other cervical disc degeneration, cervicothoracic region: Secondary | ICD-10-CM | POA: Diagnosis not present

## 2024-01-25 DIAGNOSIS — H5021 Vertical strabismus, right eye: Secondary | ICD-10-CM | POA: Diagnosis not present

## 2024-01-25 DIAGNOSIS — H26491 Other secondary cataract, right eye: Secondary | ICD-10-CM | POA: Diagnosis not present

## 2024-01-25 DIAGNOSIS — H43813 Vitreous degeneration, bilateral: Secondary | ICD-10-CM | POA: Diagnosis not present

## 2024-01-25 DIAGNOSIS — M9901 Segmental and somatic dysfunction of cervical region: Secondary | ICD-10-CM | POA: Diagnosis not present

## 2024-01-25 DIAGNOSIS — M5033 Other cervical disc degeneration, cervicothoracic region: Secondary | ICD-10-CM | POA: Diagnosis not present

## 2024-01-25 DIAGNOSIS — H532 Diplopia: Secondary | ICD-10-CM | POA: Diagnosis not present

## 2024-01-25 DIAGNOSIS — M5412 Radiculopathy, cervical region: Secondary | ICD-10-CM | POA: Diagnosis not present

## 2024-01-25 DIAGNOSIS — M9902 Segmental and somatic dysfunction of thoracic region: Secondary | ICD-10-CM | POA: Diagnosis not present

## 2024-02-01 DIAGNOSIS — H26491 Other secondary cataract, right eye: Secondary | ICD-10-CM | POA: Diagnosis not present

## 2024-03-06 ENCOUNTER — Other Ambulatory Visit: Payer: Self-pay

## 2024-03-06 ENCOUNTER — Encounter: Payer: Self-pay | Admitting: *Deleted

## 2024-03-06 ENCOUNTER — Emergency Department
Admission: EM | Admit: 2024-03-06 | Discharge: 2024-03-06 | Disposition: A | Discharge status: Home / Self Care | Attending: Emergency Medicine | Admitting: Emergency Medicine

## 2024-03-06 ENCOUNTER — Emergency Department

## 2024-03-06 DIAGNOSIS — R0789 Other chest pain: Secondary | ICD-10-CM | POA: Diagnosis not present

## 2024-03-06 DIAGNOSIS — E039 Hypothyroidism, unspecified: Secondary | ICD-10-CM | POA: Insufficient documentation

## 2024-03-06 DIAGNOSIS — W06XXXA Fall from bed, initial encounter: Secondary | ICD-10-CM | POA: Diagnosis not present

## 2024-03-06 DIAGNOSIS — Z853 Personal history of malignant neoplasm of breast: Secondary | ICD-10-CM | POA: Diagnosis not present

## 2024-03-06 DIAGNOSIS — W19XXXA Unspecified fall, initial encounter: Secondary | ICD-10-CM

## 2024-03-06 DIAGNOSIS — R0781 Pleurodynia: Secondary | ICD-10-CM | POA: Diagnosis not present

## 2024-03-06 DIAGNOSIS — R002 Palpitations: Secondary | ICD-10-CM | POA: Diagnosis not present

## 2024-03-06 MED ORDER — CYCLOBENZAPRINE HCL 10 MG PO TABS: 10.0000 mg | ORAL_TABLET | Freq: Three times a day (TID) | ORAL | 0 refills | Status: DC | PRN

## 2024-03-06 MED ORDER — LIDOCAINE 5 % EX PTCH
1.0000 | MEDICATED_PATCH | CUTANEOUS | Status: DC
  Administered 2024-03-06: 1 via TRANSDERMAL
  Filled 2024-03-06: qty 1

## 2024-03-06 MED ORDER — NAPROXEN 500 MG PO TBEC: 500.0000 mg | DELAYED_RELEASE_TABLET | Freq: Two times a day (BID) | ORAL | 0 refills | Status: DC

## 2024-03-06 MED ORDER — IBUPROFEN 600 MG PO TABS
600.0000 mg | ORAL_TABLET | Freq: Once | ORAL | Status: AC
  Administered 2024-03-06: 600 mg via ORAL
  Filled 2024-03-06: qty 1

## 2024-03-06 MED ORDER — LIDOCAINE 5 % EX PTCH: 1.0000 | MEDICATED_PATCH | CUTANEOUS | 0 refills | Status: DC

## 2024-03-06 MED ORDER — LIDOCAINE 5 % EX PTCH: 1.0000 | MEDICATED_PATCH | CUTANEOUS | 0 refills | Status: AC

## 2024-03-06 MED ORDER — CYCLOBENZAPRINE HCL 10 MG PO TABS: 10.0000 mg | ORAL_TABLET | Freq: Three times a day (TID) | ORAL | 0 refills | Status: AC | PRN

## 2024-03-06 NOTE — ED Provider Notes (Signed)
 [facility] Provider Note    Event Date/Time   First MD Initiated Contact with Patient 03/06/24 1842     (approximate)   History   Fall    HPI  Tina Armstrong is a 78 y.o. female    with a past medical history of hypothyroidism, segmental and somatic dysfunction of cervical region, cervical radiculopathy, fatigue, right hip pain, breast cancer, who presents to the ED complaining of fall. According to the patient, this morning she was sitting on her bed and the slide hitting her back.  She endorses hearing a pop sound.  Patient denies shortness of breath.  Difficulty breathing.  Patient endorses pain increased with deep breath and movement.  Patient is not taking any blood thinners.  Patient is here with her husband.     Patient Active Problem List   Diagnosis Date Noted   Ductal carcinoma in situ (DCIS) of left breast 04/15/2022   Bradycardia 10/07/2020   Intermittent palpitations 10/07/2020   SOBOE (shortness of breath on exertion) 10/07/2020   Family history of breast cancer    Acquired hypothyroidism 09/06/2019   Hypercholesterolemia 09/06/2019   RLS (restless legs syndrome) 09/06/2019   Seizure disorder (HCC) 09/06/2019   Single major depressive episode, in full remission 09/06/2019     Physical Exam   Triage Vital Signs: ED Triage Vitals  Encounter Vitals Group     BP 03/06/24 1811 120/61     Girls Systolic BP Percentile --      Girls Diastolic BP Percentile --      Boys Systolic BP Percentile --      Boys Diastolic BP Percentile --      Pulse Rate 03/06/24 1811 66     Resp 03/06/24 1811 18     Temp 03/06/24 1811 98.4 F (36.9 C)     Temp Source 03/06/24 1811 Oral     SpO2 03/06/24 1811 100 %     Weight 03/06/24 1809 121 lb (54.9 kg)     Height 03/06/24 1809 5' 3 (1.6 m)     Head Circumference --      Peak Flow --      Pain Score 03/06/24 1809 0     Pain Loc --      Pain Education --      Exclude from Growth Chart --      Most recent vital signs: Vitals:   03/06/24 1811 03/06/24 1843  BP: 120/61   Pulse: 66   Resp: 18   Temp: 98.4 F (36.9 C)   SpO2: 100% 100%     Physical Exam Vitals and nursing note reviewed.   General:          Awake, no distress.  Intact x 4 CV:                  Good peripheral perfusion.  Resp:               Normal effort. no tachypnea, no wheezing no rales no crackles. Abd:                 No distention.  Soft nontender Other:              Thoracic spine: Skin is intact, no presence of ecchymosis or hematomas.  Tenderness to palpation at the level of the left rib at the level of T6.  No depressions.  No crepitus.No tenderness to palpation in the spinal process. Upper and lower extremities signs  of trauma. ED Results / Procedures / Treatments   Labs (all labs ordered are listed, but only abnormal results are displayed) Labs Reviewed - No data to display   RADIOLOGY I independently reviewed and interpreted imaging and agree with radiologists findings.      PROCEDURES:  Critical Care performed:   Procedures   MEDICATIONS ORDERED IN ED: Medications  lidocaine  (LIDODERM ) 5 % 1 patch (1 patch Transdermal Patch Applied 03/06/24 1931)  ibuprofen  (ADVIL ) tablet 600 mg (600 mg Oral Given 03/06/24 1930)   Clinical Course as of 03/06/24 1949  Wed Mar 06, 2024  1940 DG Ribs Unilateral W/Chest Left No acute cardiopulmonary process. 2. No displaced rib fractures.   [AE]  [id number] Updated patient with results of x-ray.  Based on her pain patient is going to be discharged with Flexeril , naproxen  and lidocaine  patch.  Patient is agreeable with the plan. [AE]    Clinical Course User Index [AE] Janit Kast, PA-C    IMPRESSION / MDM / ASSESSMENT AND PLAN / ED COURSE  I reviewed the triage vital signs and the nursing notes.  Differential diagnosis includes, but is not limited to, fall, rib fracture, pneumothorax, soft tissue injury.   Patient's presentation is  most consistent with acute complicated illness / injury requiring diagnostic workup.   Tina Armstrong is a 78 y.o., female who presents today after falling and injuring her thoracic back.  Patient denied loss of consciousness.  Patient endorses pain increased with movement and when she takes a deep breath.  On a physical exam vital signs were normal at triage.  Thorax to the spine, skin is intact, no ecchymosis or hematomas.  Tenderness to palpation at the level of the left side of rib #6, no depressions, no crepitus.  Cardiopulmonary is clear.  Rest of physical exam is normal. Plan Ibuprofen  600 Lidocaine  patch X-ray Patient's diagnosis is consistent with chest wall pain. I independently reviewed and interpreted imaging and agree with radiologists findings ruling out rib fracture, pneumothorax..  I did not order any labs.  Physical exam is reassuring. I did review the patient's allergies and medications.The patient is in stable and satisfactory condition for discharge home  Patient will be discharged home with prescriptions for Flexeril , naproxen , lidocaine  patch.  I did advise patient to avoid driving while taking Flexeril .  Patient is to follow up with PCP as needed or otherwise directed. Patient is given ED precautions to return to the ED for any worsening or new symptoms. Discussed plan of care with patient, answered all of patient's questions, Patient agreeable to plan of care. Advised patient to take medications according to the instructions on the label. Discussed possible side effects of new medications. Patient verbalized understanding.  FINAL CLINICAL IMPRESSION(S) / ED DIAGNOSES   Final diagnoses:  Fall, initial encounter  Chest wall pain     Rx / DC Orders   ED Discharge Orders          Ordered    cyclobenzaprine  (FLEXERIL ) 10 MG tablet  3 times daily PRN        03/06/24 1948    naproxen  (EC NAPROSYN ) 500 MG EC tablet  2 times daily with meals        03/06/24 1948     lidocaine  (LIDODERM ) 5 %  Every 24 hours        03/06/24 1948             Note:  This document was prepared using Dragon voice recognition software and may  include unintentional dictation errors.   Janit Kast, PA-C 03/06/24 1949    Tina Pae, MD 03/06/24 [phone]

## 2024-03-06 NOTE — ED Triage Notes (Signed)
 Pt ambulatory to triage.  Pt states she sat on abench to put shoes on and slipped off the bench and fell. Pt struck her back on the bed. Pt has upper back pain.  No loc.  No neck pain.  Pt alert  speech clear

## 2024-03-06 NOTE — Discharge Instructions (Addendum)
 You have been diagnosed with fall, chest wall pain.  X-ray ruled out rib fracture, pneumothorax.  Please take Flexeril  1 tablet by mouth every 8 hours.  Please avoid driving while taking Flexeril .  If you are planning to drive please take Flexeril  only at bedtime.  You can apply lidocaine  patch under the skin every 12 hours.  Please take naproxen  1 tablet by mouth after breakfast and after dinner.  Please come back to ED or go to your PCP if symptoms or symptoms worsen.

## 2024-03-12 DIAGNOSIS — R399 Unspecified symptoms and signs involving the genitourinary system: Secondary | ICD-10-CM | POA: Diagnosis not present

## 2024-03-22 ENCOUNTER — Ambulatory Visit

## 2024-03-22 ENCOUNTER — Encounter: Payer: Self-pay | Admitting: Podiatry

## 2024-03-22 ENCOUNTER — Ambulatory Visit: Admitting: Podiatry

## 2024-03-22 VITALS — Ht 63.0 in | Wt 121.0 lb

## 2024-03-22 DIAGNOSIS — M2011 Hallux valgus (acquired), right foot: Secondary | ICD-10-CM

## 2024-03-22 DIAGNOSIS — M21619 Bunion of unspecified foot: Secondary | ICD-10-CM

## 2024-03-22 DIAGNOSIS — M21611 Bunion of right foot: Secondary | ICD-10-CM | POA: Diagnosis not present

## 2024-03-22 NOTE — Progress Notes (Signed)
 Chief Complaint  Patient presents with   Bunions    Pt is here due to bunion and hammertoe to the right foot, states she has been prolonging surgery for a while, starting to have pain would like to discuss her options.    Subjective: 78 y.o. female presenting today for follow-up evaluation of bunion and hammertoe deformity to the right foot.  She states that it is not necessarily painful but more aggravating.  She would like to discuss surgery as well as other conservative options.  Past Medical History:  Diagnosis Date   Anxiety    Family history of breast cancer    GERD (gastroesophageal reflux disease)    History of cancer    breast   Hyperlipemia    Mitral valve prolapse    Osteoporosis    Personal history of radiation therapy    breast   Seizures (HCC)    x2. age 73.  Medicine d/c'd at age 72.  No recurrence   Thyroid  disease     Past Surgical History:  Procedure Laterality Date   ABDOMINAL HYSTERECTOMY     total   APPENDECTOMY     BREAST EXCISIONAL BIOPSY Left 1995   BREAST LUMPECTOMY Left 2016   CATARACT EXTRACTION W/PHACO Left 06/21/2023   Procedure: CATARACT EXTRACTION PHACO AND INTRAOCULAR LENS PLACEMENT (IOC) LEFT  8.47  00:37.2;  Surgeon: Mittie Gaskin, MD;  Location: [facility];  Service: Ophthalmology;  Laterality: Left;   CATARACT EXTRACTION W/PHACO Right 07/05/2023   Procedure: CATARACT EXTRACTION PHACO AND INTRAOCULAR LENS PLACEMENT (IOC) RIGHT 6.20 00:37.2;  Surgeon: Mittie Gaskin, MD;  Location: [facility];  Service: Ophthalmology;  Laterality: Right;   MASTECTOMY PARTIAL / LUMPECTOMY Left 04/10/2016   MASTECTOMY, PARTIAL Left 04/12/2015   Allergies[1]  Objective: Physical Exam General: The patient is alert and oriented x3 in no acute distress.  Dermatology: Skin is cool, dry and supple bilateral lower extremities. Negative for open lesions or macerations.  Vascular: Palpable pedal pulses bilaterally. No edema or  erythema noted. Capillary refill within normal limits.  Neurological: Grossly intact via light touch  Musculoskeletal Exam: Hallux valgus deformity noted with overlapping deformity of the second digit.  There is associated tenderness to palpation of the second MTP of the foot  Radiographic Exam RT foot 03/22/2024: Increased intermetatarsal angle with medial deviation of the first metatarsal and hallux abductus with overlapping deformity of the adjacent second digit.  No fractures identified.  Assessment/plan of CARE: 1.  Hallux valgus right 2. Hammertoe second digit right with overlapping deformity  -Patient evaluated.  X-rays reviewed -Today we discussed surgical versus conservative management of the bunion and hammertoe deformity of the foot.  Surgically we discussed correction of the hammertoe and bunion as well as the postoperative recovery course.  The patient's biggest concern is the recovery after surgery.  Currently she says the bunion and hammertoe are not necessarily incredibly painful but more frustrating. -She does experience a significant amount of pain when she walks barefoot around the house.  Advised against this.  Recommend good supportive shoes and sneakers -Also recommend arch supports to offload pressure from the forefoot.  OTC prefabricated arch supports were provided -For now the patient would like to continue to pursue conservative treatment management -Return to clinic PRN    Thresa EMERSON Sar, DPM [facility]  Dr. Thresa EMERSON Sar, DPM    [address]  [place], [place] [address]                Office [phone]  Fax [phone]              [1]  Allergies Allergen Reactions   Atenolol Other (See Comments)    Made patient very shaky    Diltiazem Other (See Comments)    Made the patient very shaky

## 2024-04-16 ENCOUNTER — Inpatient Hospital Stay: Payer: Medicare HMO | Attending: Internal Medicine | Admitting: Internal Medicine

## 2024-04-16 ENCOUNTER — Encounter: Payer: Self-pay | Admitting: Internal Medicine

## 2024-04-16 DIAGNOSIS — M810 Age-related osteoporosis without current pathological fracture: Secondary | ICD-10-CM | POA: Insufficient documentation

## 2024-04-16 DIAGNOSIS — D0512 Intraductal carcinoma in situ of left breast: Secondary | ICD-10-CM | POA: Diagnosis not present

## 2024-04-16 DIAGNOSIS — Z79811 Long term (current) use of aromatase inhibitors: Secondary | ICD-10-CM | POA: Insufficient documentation

## 2024-04-16 DIAGNOSIS — Z923 Personal history of irradiation: Secondary | ICD-10-CM | POA: Diagnosis not present

## 2024-04-16 DIAGNOSIS — Z87891 Personal history of nicotine dependence: Secondary | ICD-10-CM | POA: Insufficient documentation

## 2024-04-16 DIAGNOSIS — Z9012 Acquired absence of left breast and nipple: Secondary | ICD-10-CM | POA: Diagnosis not present

## 2024-04-16 NOTE — Progress Notes (Signed)
 Patient states no questions or concerns today.

## 2024-04-16 NOTE — Assessment & Plan Note (Addendum)
#   Left breast DCIS- ER positive; early stage [2016; patient initially reported as invasive cancer no records available].  However after review of records patient noted to have left breast DCIS.  S/p radiation. Arimidex  [started AUG 2016; [place] ].  Discontinue anastrozole  in JAN 2024.  July 2024-bilateral mammogram negative for any recurrence.  # Clinical exam no evidence of recurrence. Patient prefers breast exam in cancer center, so will continue to follow up on yearly basis. Will order mammo in aug 2026.     # Osteoporosis [as per patient]/ BMD-Fosamax [per PCP].  Continue calcium plus vitamin D.  # Genetics: s/p genetics evaluation; pt is interested;-referral made however patient declined sec to cost issues.   # DISPOSITION:  # bil screening mammo in AUG 2026.   # follow up in 12  months- MD; no labs- -Dr.B

## 2024-04-16 NOTE — Progress Notes (Signed)
 [facility] CONSULT NOTE  Patient Care Team: Glover Lenis, MD as PCP - General (Family Medicine) Rennie Cindy SAUNDERS, MD as Consulting Physician (Oncology)  CHIEF COMPLAINTS/PURPOSE OF CONSULTATION: Breast cancer  #  Oncology History Overview Note  # 2016- LEFT BREAST DCIS ER POSITIVE; Her-2 NEG [s/p lumpectomy]; s/p RT;  [[place] ]; AUG 2016- anastrazole; discontinued January 2024 [given unavailability of records from [place] ]  #    Malignant neoplasm of left breast in female, estrogen receptor positive (HCC) (Resolved)  09/06/2019 Initial Diagnosis   Malignant neoplasm of left breast in female, estrogen receptor positive (HCC)   Ductal carcinoma in situ (DCIS) of left breast  04/15/2022 Initial Diagnosis   Ductal carcinoma in situ (DCIS) of left breast   04/15/2022 Cancer Staging   Staging form: Breast, AJCC 8th Edition - Clinical: Stage 0 (cTis (DCIS), cN0, cM0) - Signed by Kaislyn Gulas R, MD on 04/15/2022     HISTORY OF PRESENTING ILLNESS: Ambulating independently.  Alone.  Tina Armstrong 79 y.o.  female left breast DCIS [2016]  off anastrozole  x5 years-currently on surveillance is here for follow-up.  Discussed the use of AI scribe software for clinical note transcription with the patient, who gave verbal consent to proceed.  History of Present Illness   Tina Armstrong is a 79 year old female with ductal carcinoma in situ (DCIS) of the left breast who presents for routine oncology follow-up.  She is not receiving active therapy and reports no new symptoms or changes in health status since her last visit.  Her most recent mammogram was performed in July.  She has not had a breast exam this year prior to today's visit and currently feels well without concerns or complaints.       Review of Systems  Constitutional:  Negative for chills, diaphoresis, fever, malaise/fatigue and weight loss.  HENT:  Negative for nosebleeds and sore throat.   Eyes:   Negative for double vision.  Respiratory:  Negative for cough, hemoptysis, sputum production, shortness of breath and wheezing.   Cardiovascular:  Negative for chest pain, palpitations, orthopnea and leg swelling.  Gastrointestinal:  Negative for abdominal pain, blood in stool, constipation, diarrhea, heartburn, melena, nausea and vomiting.  Genitourinary:  Negative for dysuria, frequency and urgency.  Musculoskeletal:  Negative for back pain and joint pain.  Skin: Negative.  Negative for itching and rash.  Neurological:  Negative for dizziness, tingling, focal weakness, weakness and headaches.  Endo/Heme/Allergies:  Does not bruise/bleed easily.  Psychiatric/Behavioral:  Negative for depression. The patient is not nervous/anxious and does not have insomnia.      MEDICAL HISTORY:  Past Medical History:  Diagnosis Date   Anxiety    Family history of breast cancer    GERD (gastroesophageal reflux disease)    History of cancer    breast   Hyperlipemia    Mitral valve prolapse    Osteoporosis    Personal history of radiation therapy    breast   Seizures (HCC)    x2. age 38.  Medicine d/c'd at age 1.  No recurrence   Thyroid  disease     SURGICAL HISTORY: Past Surgical History:  Procedure Laterality Date   ABDOMINAL HYSTERECTOMY     total   APPENDECTOMY     BREAST EXCISIONAL BIOPSY Left 1995   BREAST LUMPECTOMY Left 2016   CATARACT EXTRACTION W/PHACO Left 06/21/2023   Procedure: CATARACT EXTRACTION PHACO AND INTRAOCULAR LENS PLACEMENT (IOC) LEFT  8.47  00:37.2;  Surgeon: Mittie Gaskin, MD;  Location: [facility];  Service: Ophthalmology;  Laterality: Left;   CATARACT EXTRACTION W/PHACO Right 07/05/2023   Procedure: CATARACT EXTRACTION PHACO AND INTRAOCULAR LENS PLACEMENT (IOC) RIGHT 6.20 00:37.2;  Surgeon: Mittie Gaskin, MD;  Location: [facility];  Service: Ophthalmology;  Laterality: Right;   MASTECTOMY PARTIAL / LUMPECTOMY Left 04/10/2016    MASTECTOMY, PARTIAL Left 04/12/2015    SOCIAL HISTORY: Social History   Socioeconomic History   Marital status: Widowed    Spouse name: Not on file   Number of children: Not on file   Years of education: Not on file   Highest education level: Not on file  Occupational History   Not on file  Tobacco Use   Smoking status: Former    Current packs/day: 0.00    Average packs/day: 0.2 packs/day for 5.0 years (0.8 ttl pk-yrs)    Types: Cigarettes    Start date: 2002    Quit date: 2007    Years since quitting: 19.0   Smokeless tobacco: Never  Vaping Use   Vaping status: Never Used  Substance and Sexual Activity   Alcohol use: Yes    Alcohol/week: 3.0 standard drinks of alcohol    Types: 3 Glasses of wine per week   Drug use: Never   Sexual activity: Not on file  Other Topics Concern   Not on file  Social History Narrative   Moved from [place] at age of 12 years; moved from [place]  to [facility] for family reasons- lives in [place] daughter.  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 Was in social research officer, government.    Social Drivers of Health   Tobacco Use: Medium Risk (04/16/2024)   Patient History    Smoking Tobacco Use: Former    Smokeless Tobacco Use: Never    Passive Exposure: Not on file  Financial Resource Strain: Low Risk  (05/30/2023)   Received from [facility]   Overall Financial Resource Strain (CARDIA)    Difficulty of Paying Living Expenses: Not hard at all  Food Insecurity: No Food Insecurity (05/30/2023)   Received from [facility]   Epic    Within the past 12 months, you worried that your food would run out before you got the money to buy more.: Never true    Within the past 12 months, the food you bought just didn't last and you didn't have money to get more.: Never true  Transportation Needs: No Transportation Needs (05/30/2023)   Received from [facility] - Transportation    In the past 12 months, has lack of  transportation kept you from medical appointments or from getting medications?: No    Lack of Transportation (Non-Medical): No  Physical Activity: Not on file  Stress: Not on file  Social Connections: Not on file  Intimate Partner Violence: Not on file  Depression (PHQ2-9): Low Risk (04/16/2024)   Depression (PHQ2-9)    PHQ-2 Score: 0  Alcohol Screen: Not on file  Housing: Low Risk  (05/30/2023)   Received from [facility]   Epic    In the last 12 months, was there a time when you were not able to pay the mortgage or rent on time?: No    In the past 12 months, how many times have you moved where you were living?: 0    At any time in the past 12 months, were you homeless or living in a shelter (including now)?: No  Utilities: Not At Risk (05/30/2023)  Received from [facility] Utilities    Threatened with loss of utilities: No  Health Literacy: Not on file    FAMILY HISTORY: Family History  Problem Relation Age of Onset   Breast cancer Mother 68   Heart attack Father    Heart attack Brother    Osteoporosis Paternal Grandmother    Heart attack Brother    Heart attack Brother     ALLERGIES:  is allergic to atenolol and diltiazem.  MEDICATIONS:  Current Outpatient Medications  Medication Sig Dispense Refill   alendronate (FOSAMAX) 70 MG tablet Take 70 mg by mouth once a week.     aspirin EC 81 MG tablet Take 81 mg by mouth daily. Swallow whole.     atorvastatin (LIPITOR) 20 MG tablet Take 1 tablet by mouth daily.     Calcium Carb-Cholecalciferol (CALCIUM 1000 + D PO) Take 1 tablet by mouth daily.     Cholecalciferol 25 MCG (1000 UT) capsule Take 1,000 Units by mouth daily.     cyanocobalamin  1000 MCG tablet Take 1,000 mcg by mouth daily.     ferrous sulfate 325 (65 FE) MG tablet Take 325 mg by mouth daily.     gabapentin (NEURONTIN) 300 MG capsule Take 300 mg by mouth 2 (two) times daily.     levothyroxine (SYNTHROID) 100 MCG tablet  Take 100 mcg by mouth daily.     pantoprazole (PROTONIX) 20 MG tablet Take 20 mg by mouth 2 (two) times daily.     potassium gluconate 595 (99 K) MG TABS tablet Take 595 mg by mouth daily.     zinc gluconate 50 MG tablet Take 50 mg by mouth daily.     No current facility-administered medications for this visit.      Right and left BREAST exam [in the presence of nurse]- no unusual skin changes or dominant masses felt.  Left breast telangiectasias /surgical scars noted.   PHYSICAL EXAMINATION: ECOG PERFORMANCE STATUS: 0 - Asymptomatic  Vitals:   04/16/24 1333  BP: 104/70  Pulse: 63  Temp: (!) 96.6 F (35.9 C)  SpO2: 100%    Filed Weights   04/16/24 1333  Weight: 126 lb (57.2 kg)     Physical Exam HENT:     Head: Normocephalic and atraumatic.     Mouth/Throat:     Pharynx: No oropharyngeal exudate.  Eyes:     Pupils: Pupils are equal, round, and reactive to light.  Cardiovascular:     Rate and Rhythm: Normal rate and regular rhythm.  Pulmonary:     Effort: Pulmonary effort is normal. No respiratory distress.     Breath sounds: Normal breath sounds. No wheezing.  Abdominal:     General: Bowel sounds are normal. There is no distension.     Palpations: Abdomen is soft. There is no mass.     Tenderness: There is no abdominal tenderness. There is no guarding or rebound.  Musculoskeletal:        General: No tenderness. Normal range of motion.     Cervical back: Normal range of motion and neck supple.  Skin:    General: Skin is warm.     Comments: Right and left BREAST exam [in the presence of nurse]- no unusual skin changes or dominant masses felt. Surgical scars noted.    Neurological:     Mental Status: She is alert and oriented to person, place, and time.  Psychiatric:        Mood and  Affect: Affect normal.      LABORATORY DATA:  I have reviewed the data as listed Lab Results  Component Value Date   WBC 5.0 10/08/2021   HGB 13.1 10/08/2021   HCT 39.9  10/08/2021   MCV 96.6 10/08/2021   PLT 220 10/08/2021   No results for input(s): NA, K, CL, CO2, GLUCOSE, BUN, CREATININE, CALCIUM, GFRNONAA, GFRAA, PROT, ALBUMIN, AST, ALT, ALKPHOS, BILITOT, BILIDIR, IBILI in the last 8760 hours.   RADIOGRAPHIC STUDIES: I have personally reviewed the radiological images as listed and agreed with the findings in the report. DG Foot Complete Right Result Date: 03/22/2024 Please see detailed radiograph report in office note.   ASSESSMENT & PLAN:   Ductal carcinoma in situ (DCIS) of left breast # Left breast DCIS- ER positive; early stage [2016; patient initially reported as invasive cancer no records available].  However after review of records patient noted to have left breast DCIS.  S/p radiation. Arimidex  [started AUG 2016; [place] ].  Discontinue anastrozole  in JAN 2024.  July 2024-bilateral mammogram negative for any recurrence.  # Clinical exam no evidence of recurrence. Patient prefers breast exam in cancer center, so will continue to follow up on yearly basis. Will order mammo in aug 2026.     # Osteoporosis [as per patient]/ BMD-Fosamax [per PCP].  Continue calcium plus vitamin D.  # Genetics: s/p genetics evaluation; pt is interested;-referral made however patient declined sec to cost issues.   # DISPOSITION:  # bil screening mammo in AUG 2026.   # follow up in 12  months- MD; no labs- -Dr.B    All questions were answered. The patient/family knows to call the clinic with any problems, questions or concerns.    Cindy JONELLE Joe, MD 04/16/2024 2:27 PM

## 2024-05-01 ENCOUNTER — Other Ambulatory Visit: Payer: Self-pay

## 2024-05-01 DIAGNOSIS — R1010 Upper abdominal pain, unspecified: Secondary | ICD-10-CM

## 2024-05-16 ENCOUNTER — Ambulatory Visit: Admission: RE | Admit: 2024-05-16 | Discharge: 2024-05-16 | Disposition: A | Source: Ambulatory Visit

## 2024-05-16 DIAGNOSIS — R1010 Upper abdominal pain, unspecified: Secondary | ICD-10-CM

## 2024-05-23 ENCOUNTER — Encounter: Admission: RE | Payer: Self-pay | Source: Home / Self Care

## 2024-05-23 ENCOUNTER — Ambulatory Visit: Admission: RE | Admit: 2024-05-23 | Source: Home / Self Care | Admitting: Gastroenterology

## 2025-04-16 ENCOUNTER — Inpatient Hospital Stay: Admitting: Internal Medicine
